# Patient Record
Sex: Male | Born: 1980 | Race: Black or African American | Hispanic: No | Marital: Single | Smoking: Former smoker
Health system: Southern US, Community
[De-identification: ages and names within clinical notes are randomized; demographics above are authoritative.]

## PROBLEM LIST (undated history)

## (undated) DIAGNOSIS — M719 Bursopathy, unspecified: Secondary | ICD-10-CM

## (undated) DIAGNOSIS — G43909 Migraine, unspecified, not intractable, without status migrainosus: Secondary | ICD-10-CM

## (undated) DIAGNOSIS — I10 Essential (primary) hypertension: Secondary | ICD-10-CM

## (undated) DIAGNOSIS — E669 Obesity, unspecified: Secondary | ICD-10-CM

## (undated) DIAGNOSIS — B019 Varicella without complication: Secondary | ICD-10-CM

## (undated) HISTORY — DX: Migraine, unspecified, not intractable, without status migrainosus: G43.909

## (undated) HISTORY — DX: Varicella without complication: B01.9

## (undated) HISTORY — DX: Essential (primary) hypertension: I10

---

## 2014-12-09 ENCOUNTER — Ambulatory Visit (INDEPENDENT_AMBULATORY_CARE_PROVIDER_SITE_OTHER)
Admission: RE | Admit: 2014-12-09 | Discharge: 2014-12-09 | Disposition: A | Discharge status: Home / Self Care | Payer: PRIVATE HEALTH INSURANCE | Source: Ambulatory Visit | Attending: Family | Admitting: Family

## 2014-12-09 ENCOUNTER — Ambulatory Visit (INDEPENDENT_AMBULATORY_CARE_PROVIDER_SITE_OTHER): Payer: PRIVATE HEALTH INSURANCE | Admitting: Family

## 2014-12-09 ENCOUNTER — Encounter: Payer: Self-pay | Admitting: Family

## 2014-12-09 VITALS — BP 182/122 | HR 88 | Temp 98.1°F | Resp 18 | Ht 71.0 in | Wt 275.1 lb

## 2014-12-09 DIAGNOSIS — Z7189 Other specified counseling: Secondary | ICD-10-CM

## 2014-12-09 DIAGNOSIS — M25619 Stiffness of unspecified shoulder, not elsewhere classified: Secondary | ICD-10-CM

## 2014-12-09 DIAGNOSIS — M7580 Other shoulder lesions, unspecified shoulder: Secondary | ICD-10-CM

## 2014-12-09 DIAGNOSIS — Z7689 Persons encountering health services in other specified circumstances: Secondary | ICD-10-CM

## 2014-12-09 DIAGNOSIS — I10 Essential (primary) hypertension: Secondary | ICD-10-CM

## 2014-12-09 MED ORDER — LISINOPRIL 20 MG PO TABS: 20.0000 mg | ORAL_TABLET | Freq: Every day | ORAL | Status: DC

## 2014-12-09 NOTE — Patient Instructions (Signed)
Thank you for choosing [facility].  Summary/Instructions:  Your prescription(s) have been submitted to your pharmacy or been printed and provided for you. Please take as directed and contact our office if you believe you are having problem(s) with the medication(s) or have any questions.  Please stop by radiology on the basement level of the building for your x-rays. Your results will be released to MyChart (or called to you) after review, usually within 72 hours after test completion. If any treatments or changes are necessary, you will be notified at that same time.  Referrals have been made during this visit. You should expect to hear back from our schedulers in about 7-10 days in regards to establishing an appointment with the specialists we discussed.   If your symptoms worsen or fail to improve, please contact our office for further instruction, or in case of emergency go directly to the emergency room at the closest medical facility.   Please start taking 2 of the current 10 mg lisinopril tablets until completed. And then began the 20 mg tablets of lisinopril daily.  For your shoulders recommend stretching 3-4 times per day for 20-30 seconds at a time for each motion of his shoulder. For pain consider ibuprofen or Aleve as needed.  Adhesive Capsulitis Sometimes the shoulder becomes stiff and is painful to move. Some people say it feels as if the shoulder is frozen in place. Because of this, the condition is called "frozen shoulder." Its medical name is adhesive capsulitis.  The shoulder joint is made up of strong connective tissue that attaches the ball of the humerus to the shallow shoulder socket. This strong connective tissue is called the joint capsule. This tissue can become stiff and swollen. That is when adhesive capsulitis sets in. CAUSES  It is not always clear just what the cause adhesive capsulitis. Possibilities include:  Injury to the shoulder joint.  Strain. This  is a repetitive injury brought about by overuse.  Lack of use. Perhaps your arm or hand was otherwise injured. It might have been in a sling for awhile. Or perhaps you were not using it to avoid pain.  Referred pain. This is a sort of trick the body plays. You feel pain in the shoulder. But, the pain actually comes from an injury somewhere else in the body.  [place]-standing health problems. Several diseases can cause adhesive capsulitis. They include diabetes, heart disease, stroke, thyroid problems, rheumatoid arthritis and lung disease.  Being a women older than 40. Anyone can develop adhesive capsulitis but it is most common in women in this age group. SYMPTOMS   Pain.  It occurs when the arm is moved.  Parts of the shoulder might hurt if they are touched.  Pain is worse at night or when resting.  Soreness. It might not be strong enough to be called pain. But, the shoulder aches.  The shoulder does not move freely.  Muscle spasms.  Trouble sleeping because of shoulder ache or pain. DIAGNOSIS  To decide if you have adhesive capsulitis, your healthcare provider will probably:  Ask about symptoms you have noticed.  Ask about your history of joint pain and anything that might have caused the pain.  Ask about your overall health.  Use hands to feel your shoulder and neck.  Ask you to move your shoulder in specific directions. This may indicate the origin of the pain.  Order imaging tests; pictures of the shoulder. They help pinpoint the source of the problem. An X-ray might  be used. For more detail, an MRI is often used. An MRI details the tendons, muscles and ligaments as well as the joint. TREATMENT  Adhesive capsulitis can be treated several ways. Most treatments can be done in a clinic or in your healthcare provider's office. Be sure to discuss the different options with your caregiver. They include:  Physical therapy. You will work on specific exercises to get your  shoulder moving again. The exercises usually involve stretching. A physical therapist (a caregiver with special training) can show you what to do and what not to do. The exercises will need to be done daily.  Medication.  Over-the-counter medicines may relieve pain and inflammation (the body's way of reacting to injury or infection).  Corticosteroids. These are stronger drugs to reduce pain and inflammation. They are given by injection (shots) into the shoulder joint. Frequent treatment is not recommended.  Muscle relaxants. Medication may be prescribed to ease muscle spasms.  Treatment of underlying conditions. This means treating another condition that is causing your shoulder problem. This might be a rotator cuff (tendon) problem  Shoulder manipulation. The shoulder will be moved by your healthcare provider. You would be under general anesthesia (given a drug that puts you to sleep). You would not feel anything. Sometimes the joint will be injected with salt water (saline) at high pressure to break down internal scarring in the joint capsule.  Surgery. This is rarely needed. It may be suggested in advanced cases after all other treatment has failed. PROGNOSIS  In time, most people recover from adhesive capsulitis. Sometimes, however, the pain goes away but full movement of the shoulder does not return.  HOME CARE INSTRUCTIONS   Take any pain medications recommended by your healthcare provider. Follow the directions carefully.  If you have physical therapy, follow through with the therapist's suggestions. Be sure you understand the exercises you will be doing. You should understand:  How often the exercises should be done.  How many times each exercise should be repeated.  How Omar they should be done.  What other activities you should do, or not do.  That you should warm up before doing any exercise. Just 5 to 10 minutes will help. Small, gentle movements should get your shoulder  ready for more.  Avoid high-demand exercise that involves your shoulder such as throwing. This type of exercise can make pain worse.  Consider using cold packs. Cold may ease swelling and pain. Ask your healthcare provider if a cold pack might help you. If so, get directions on how and when to use them. SEEK MEDICAL CARE IF:   You have any questions about your medications.  Your pain continues to increase. Document Released: 09/02/2009 Document Revised: 01/28/2012 Document Reviewed: 09/02/2009 [facility] Patient Information 2015 [place], [place]. This information is not intended to replace advice given to you by your health care provider. Make sure you discuss any questions you have with your health care provider.

## 2014-12-09 NOTE — Assessment & Plan Note (Signed)
Bilateral limited range of motion of shoulders present. Obtain x-rays to rule out calcification. Concern for adhesive capsulitis or rotator cuff pathology. Refer to sports medicine for further evaluation. Recommend stretching 3-4 times daily and also directions. May use over-the-counter anti-inflammatories as needed for pain relief. Follow-up if symptoms worsen or fail to improve.

## 2014-12-09 NOTE — Progress Notes (Signed)
Pre visit review using our clinic review tool, if applicable. No additional management support is needed unless otherwise documented below in the visit note. 

## 2014-12-09 NOTE — Progress Notes (Signed)
Subjective:    Patient ID: Shawn James Fikes, male    DOB: 12-11-1980, 34 y.o.   MRN: [id number]  Chief Complaint  Patient presents with  . Establish Care    having problems with arms locking up sending a dull pain to shoulders, x2 weeks    HPI:  Shawn James Courter is a 34 y.o. male who presents today to establish care and discuss arms locking up.   Associated symptoms of sharp/dull pain in both shoulders occurs when he raises his arms overhead. This has been going on for a couple of weeks. Denies any modifying factors. Intensity is rated around a 6/10 when it occurs. Denis any trauma or neck pain.    No Known Allergies  No current outpatient prescriptions on file prior to visit.   No current facility-administered medications on file prior to visit.    Past Medical History  Diagnosis Date  . Hypertension   . Chicken pox   . Migraine     No past surgical history on file.  No family history on file.  History   Social History  . Marital Status: Single    Spouse Name: N/A    Number of Children: 1  . Years of Education: 15   Occupational History  . [employer]   . [employer] Worker    Social History Main Topics  . Smoking status: Former Smoker -- 2 years    Types: Cigars  . Smokeless tobacco: Never Used  . Alcohol Use: 0.6 oz/week    0 Not specified, 1 Cans of beer per week  . Drug Use: No  . Sexual Activity: Not on file   Other Topics Concern  . Not on file   Social History Narrative   [place], [place] Currently lives in an apartment with by himself. No pets. Fun: Movies, eating out, travel   Denies religious beliefs that would effect health care.     Review of Systems  Constitutional: Negative for fever, chills and activity change.  Eyes:       Negative for change in vision  Respiratory: Negative for chest tightness and shortness of breath.   Cardiovascular: Negative for chest pain, palpitations and leg swelling.  Musculoskeletal: Negative  for neck pain.       Positive for shoulder pain  Neurological: Negative for numbness.      Objective:    BP 182/122 mmHg  Pulse 88  Temp(Src) 98.1 F (36.7 C) (Oral)  Resp 18  Ht 5\' 11"  (1.803 m)  Wt 275 lb 1.9 oz (124.794 kg)  BMI 38.39 kg/m2  SpO2 95% Nursing note and vital signs reviewed.  Physical Exam  Constitutional: He is oriented to person, place, and time. He appears well-developed and well-nourished. No distress.  Cardiovascular: Normal rate, regular rhythm, normal heart sounds and intact distal pulses.   Pulmonary/Chest: Effort normal and breath sounds normal.  Musculoskeletal:  Right shoulder: No obvious deformity, discoloration, or edema noted. Palpable tenderness in subacromial space around insertion of supraspinatus. Significant decreased active range of motion to 90 in both shoulder flexion and abduction. Can surpass 90 in shoulder flexion with passive range of motion however gains are only approximately 10.  Left shoulder: No obvious deformity, discoloration, or edema noted. Palpable tenderness subacromial space around insertion of supraspinatus. Significant decreased active range of motion to 90 in both shoulder flexion and abduction. Passive range of motion is similar with a 90 restriction.  Neurological: He is alert and oriented to person,  place, and time.  Skin: Skin is warm and dry.  Psychiatric: He has a normal mood and affect. His behavior is normal. Judgment and thought content normal.       Assessment & Plan:

## 2014-12-09 NOTE — Assessment & Plan Note (Signed)
Blood pressure remains labile and above goal. He is currently taking lisinopril 10 mg. Increase lisinopril to 20 mg. Follow-up in 2 weeks to determine blood pressure control.

## 2014-12-12 ENCOUNTER — Telehealth: Payer: Self-pay | Admitting: Family

## 2014-12-12 NOTE — Telephone Encounter (Signed)
Please inform the patient that his right shoulder x-ray was normal with no fractures or abnormalities. However his left shoulder did show some calcification in his rotator cuff tendon, therefore please continue stretching and keep his appointment with Sports Medicine when it is scheduled. We may also need to consider referral to orthopedic surgery pending Dr. Michaelle CopasSmith's evaluation.

## 2014-12-13 NOTE — Telephone Encounter (Signed)
Pt aware of results. He understood and had no further questions.

## 2014-12-24 ENCOUNTER — Encounter: Payer: Self-pay | Admitting: Family Medicine

## 2014-12-24 ENCOUNTER — Ambulatory Visit (INDEPENDENT_AMBULATORY_CARE_PROVIDER_SITE_OTHER): Payer: PRIVATE HEALTH INSURANCE | Admitting: Family Medicine

## 2014-12-24 ENCOUNTER — Other Ambulatory Visit (INDEPENDENT_AMBULATORY_CARE_PROVIDER_SITE_OTHER): Payer: PRIVATE HEALTH INSURANCE

## 2014-12-24 VITALS — BP 148/90 | HR 112 | Ht 71.0 in | Wt 275.0 lb

## 2014-12-24 DIAGNOSIS — M7552 Bursitis of left shoulder: Secondary | ICD-10-CM

## 2014-12-24 DIAGNOSIS — M25511 Pain in right shoulder: Secondary | ICD-10-CM

## 2014-12-24 DIAGNOSIS — M7551 Bursitis of right shoulder: Secondary | ICD-10-CM | POA: Insufficient documentation

## 2014-12-24 NOTE — Patient Instructions (Signed)
Good to see you.  Ice 20 minutes 2 times daily. Usually after activity and before bed. Exercises 3 times a week.  Pennsaid twice daily See me again in 3 week.s

## 2014-12-24 NOTE — Assessment & Plan Note (Signed)
She was given injections bilaterally today. We discussed icing regimen at home exercises. We discussed avoiding certain activities. Patient did respond well to the injections. We discussed over-the-counter medications as well. Patient and will come back and see me again in 3 weeks to make sure that patient pain should be nearly completely resolved.

## 2014-12-24 NOTE — Progress Notes (Signed)
Shawn James D.O. [facility] [address] [place], [place] [address] Phone: [phone]) [phone] Subjective:    I'm seeing this patient by the request  of:  Jeanine Luzalone, Gregory, FNP   CC: Bilateral shoulder pain  MVH:[id number]:Subjective Shawn GarnerBrandon James is a 34 y.o. male coming in with complaint of bilateral shoulder pain. Patient has had this pain for quite some time. Patient does not remember any true specific injury. Patient was seen by his primary care provider and there were was x-rays taken. Patient did have some mild calcific tendinosis noted of the left rotator cuff but otherwise fairly unremarkable. Patient denies any neck pain with it. States that if he lists his shoulders above 90 bilaterally and had some pain. Patient actually states that the range of motion of the shoulders are full but he does have some mild discomfort that can keep him from certain activities. Denies any nighttime awakening her any radiation down the arms. Patient puts the severity of pain is 8 out of 10 in severity. Has not tried any significant over-the-counter medications.     Past medical history, social, surgical and family history all reviewed in electronic medical record.   Review of Systems: No headache, visual changes, nausea, vomiting, diarrhea, constipation, dizziness, abdominal pain, skin rash, fevers, chills, night sweats, weight loss, swollen lymph nodes, body aches, joint swelling, muscle aches, chest pain, shortness of breath, mood changes.   Objective Blood pressure 148/90, pulse 112, height 5\' 11"  (1.803 m), weight 275 lb (124.739 kg), SpO2 97 %.  General: No apparent distress alert and oriented x3 mood and affect normal, dressed appropriately.  HEENT: Pupils equal, extraocular movements intact  Respiratory: Patient's speak in full sentences and does not appear short of breath  Cardiovascular: No lower extremity edema, non tender, no erythema  Skin: Warm dry intact with no signs of infection or rash on  extremities or on axial skeleton.  Abdomen: Soft nontender  Neuro: Cranial nerves II through XII are intact, neurovascularly intact in all extremities with 2+ DTRs and 2+ pulses.  Lymph: No lymphadenopathy of posterior or anterior cervical chain or axillae bilaterally.  Gait normal with good balance and coordination.  MSK:  Non tender with full range of motion and good stability and symmetric strength and tone of  elbows, wrist, hip, knee and ankles bilaterally.  Shoulder: Bilateral Inspection reveals no abnormalities, atrophy or asymmetry. Palpation is normal with no tenderness over AC joint or bicipital groove. ROM is full in all planes passively. Rotator cuff strength normal throughout. signs of impingement with positive Neer and Hawkin's tests, but negative empty can sign. Speeds and Yergason's tests normal. No labral pathology noted with negative Obrien's, negative clunk and good stability. Normal scapular function observed. No painful arc and no drop arm sign. No apprehension sign  MSK US performed of: Bilateral This study was ordered, performed, and interpreted by Terrilee FilesZach Cheyan Frees D.O.  Shoulder:   Supraspinatus:  Appears normal on [place] and transverse views, Bursal bulge seen with shoulder abduction on impingement view. Patient's left shoulder does have calcific changes of the bursa. No true tear appreciated. Infraspinatus:  Appears normal on [place] and transverse views. Significant increase in Doppler flow Subscapularis:  Appears normal on Monestime and transverse views. Positive bursa Teres Minor:  Appears normal on Faas and transverse views. AC joint:  Mild arthritis Glenohumeral Joint:  Appears normal without effusion. Glenoid Labrum:  Intact without visualized tears. Biceps Tendon:  Appears normal on [place] and transverse views, no fraying of tendon, tendon  located in intertubercular groove, no subluxation with shoulder internal or external rotation.  Impression: Subacromial bursitis  bilaterally with mild calcific changes  Procedure: Real-time Ultrasound Guided Injection of right glenohumeral joint Device: GE Logiq E  Ultrasound guided injection is preferred based studies that show increased duration, increased effect, greater accuracy, decreased procedural pain, increased response rate with ultrasound guided versus blind injection.  Verbal informed consent obtained.  Time-out conducted.  Noted no overlying erythema, induration, or other signs of local infection.  Skin prepped in a sterile fashion.  Local anesthesia: Topical Ethyl chloride.  With sterile technique and under real time ultrasound guidance:  Joint visualized.  23g 1  inch needle inserted posterior approach. Pictures taken for needle placement. Patient did have injection of 2 cc of 1% lidocaine, 2 cc of 0.5% Marcaine, and 1.0 cc of Kenalog 40 mg/dL. Completed without difficulty  Pain immediately resolved suggesting accurate placement of the medication.  Advised to call if fevers/chills, erythema, induration, drainage, or persistent bleeding.  Images permanently stored and available for review in the ultrasound unit.  Impression: Technically successful ultrasound guided injection.  Procedure: Real-time Ultrasound Guided Injection of left glenohumeral joint Device: GE Logiq E  Ultrasound guided injection is preferred based studies that show increased duration, increased effect, greater accuracy, decreased procedural pain, increased response rate with ultrasound guided versus blind injection.  Verbal informed consent obtained.  Time-out conducted.  Noted no overlying erythema, induration, or other signs of local infection.  Skin prepped in a sterile fashion.  Local anesthesia: Topical Ethyl chloride.  With sterile technique and under real time ultrasound guidance:  Joint visualized.  23g 1  inch needle inserted posterior approach. Pictures taken for needle placement. Patient did have injection of 2 cc of 1%  lidocaine, 2 cc of 0.5% Marcaine, and 1cc of Kenalog 40 mg/dL. Completed without difficulty  Pain immediately resolved suggesting accurate placement of the medication.  Advised to call if fevers/chills, erythema, induration, drainage, or persistent bleeding.  Images permanently stored and available for review in the ultrasound unit.  Impression: Technically successful ultrasound guided injection.    Impression and Recommendations:     This case required medical decision making of moderate complexity.

## 2014-12-24 NOTE — Progress Notes (Signed)
Pre visit review using our clinic review tool, if applicable. No additional management support is needed unless otherwise documented below in the visit note. 

## 2015-01-13 ENCOUNTER — Encounter: Payer: Self-pay | Admitting: Family

## 2015-01-14 ENCOUNTER — Ambulatory Visit: Payer: PRIVATE HEALTH INSURANCE | Admitting: Family Medicine

## 2015-01-24 ENCOUNTER — Telehealth: Payer: Self-pay | Admitting: Family Medicine

## 2015-01-24 ENCOUNTER — Ambulatory Visit: Payer: PRIVATE HEALTH INSURANCE | Admitting: Family Medicine

## 2015-01-24 NOTE — Telephone Encounter (Signed)
Patient a no show. Ok to reschedule  ? °

## 2015-01-25 NOTE — Telephone Encounter (Signed)
Yes

## 2015-02-07 ENCOUNTER — Encounter: Payer: Self-pay | Admitting: Family Medicine

## 2015-02-07 ENCOUNTER — Ambulatory Visit (INDEPENDENT_AMBULATORY_CARE_PROVIDER_SITE_OTHER): Payer: 59 | Admitting: Family Medicine

## 2015-02-07 VITALS — BP 144/90 | HR 92 | Ht 71.0 in | Wt 269.0 lb

## 2015-02-07 DIAGNOSIS — M7552 Bursitis of left shoulder: Secondary | ICD-10-CM

## 2015-02-07 DIAGNOSIS — M7551 Bursitis of right shoulder: Secondary | ICD-10-CM

## 2015-02-07 NOTE — Progress Notes (Signed)
  Tawana ScaleZach Giovanni Bath D.O.  [facility] [address] [place], [place] [address] Phone: [phone] Subjective:    CC: Bilateral shoulder pain follow-up  NGE:[id number]:Subjective Shawn GarnerBrandon Spicher is a 34 y.o. male coming in with complaint of bilateral shoulder pain. Patient was found to have a subacromial bursitis bilaterally secondary to mostly his employment and poor posture. Patient states he is approximately 75% better. Patient states that he is able to do daily activities much better and is resting comfortably at night. Denies any radiation down the arm or any numbness or tingling.      Past medical history, social, surgical and family history all reviewed in electronic medical record.   Review of Systems: No headache, visual changes, nausea, vomiting, diarrhea, constipation, dizziness, abdominal pain, skin rash, fevers, chills, night sweats, weight loss, swollen lymph nodes, body aches, joint swelling, muscle aches, chest pain, shortness of breath, mood changes.   Objective Blood pressure 144/90, pulse 92, height 5\' 11"  (1.803 m), weight 269 lb (122.018 kg), SpO2 93 %.  General: No apparent distress alert and oriented x3 mood and affect normal, dressed appropriately.  HEENT: Pupils equal, extraocular movements intact  Respiratory: Patient's speak in full sentences and does not appear short of breath  Cardiovascular: No lower extremity edema, non tender, no erythema  Skin: Warm dry intact with no signs of infection or rash on extremities or on axial skeleton.  Abdomen: Soft nontender  Neuro: Cranial nerves II through XII are intact, neurovascularly intact in all extremities with 2+ DTRs and 2+ pulses.  Lymph: No lymphadenopathy of posterior or anterior cervical chain or axillae bilaterally.  Gait normal with good balance and coordination.  MSK:  Non tender with full range of motion and good stability and symmetric strength and tone of  elbows, wrist, hip, knee and ankles bilaterally.  Shoulder:  Bilateral Inspection reveals no abnormalities, atrophy or asymmetry. Palpation is normal with no tenderness over AC joint or bicipital groove. ROM is full in all planes passively. Rotator cuff strength normal throughout. signs of impingement with positive Neer and Hawkin's tests, but negative empty can sign. This is somewhat better than previous exam Speeds and Yergason's tests normal. No labral pathology noted with negative Obrien's, negative clunk and good stability. Normal scapular function observed. No painful arc and no drop arm sign. No apprehension sign      Impression and Recommendations:     This case required medical decision making of moderate complexity.

## 2015-02-07 NOTE — Progress Notes (Signed)
Pre visit review using our clinic review tool, if applicable. No additional management support is needed unless otherwise documented below in the visit note. 

## 2015-02-07 NOTE — Patient Instructions (Signed)
Good to see you Ice 20 minutes 2 times daily. Usually after activity and before bed. Continue the exercises 3 times a week for another 6 weeks On wall heels butt shoulder and head touching for goal og 5 minutes daily See me agai nin 6 weeks if not perfect!

## 2015-02-07 NOTE — Assessment & Plan Note (Signed)
Patient is doing per day well with conservative therapy. Patient encouraged to continue home exercises 3 times a week for the next 6 weeks. We discussed icing regimen and home exercises. Patient will come back and see me again in 6 weeks

## 2015-02-15 ENCOUNTER — Encounter: Payer: Self-pay | Admitting: Family

## 2015-02-17 MED ORDER — TADALAFIL 10 MG PO TABS: 10.0000 mg | ORAL_TABLET | Freq: Every day | ORAL | Status: DC | PRN

## 2015-02-18 MED ORDER — TADALAFIL 5 MG PO TABS: 5.0000 mg | ORAL_TABLET | Freq: Every day | ORAL | Status: DC | PRN

## 2015-02-21 ENCOUNTER — Telehealth: Payer: Self-pay

## 2015-02-21 NOTE — Telephone Encounter (Signed)
PA started via covermymeds. Cialis 5 mg (10 for 30 days)

## 2015-02-22 NOTE — Telephone Encounter (Signed)
PA for cialis was denied.   Pt informed that rx was denied.

## 2015-03-11 ENCOUNTER — Other Ambulatory Visit: Payer: Self-pay | Admitting: Family

## 2015-03-11 MED ORDER — LISINOPRIL 20 MG PO TABS: 20.0000 mg | ORAL_TABLET | Freq: Every day | ORAL | Status: DC

## 2015-03-21 ENCOUNTER — Ambulatory Visit: Payer: 59 | Admitting: Family

## 2015-03-21 ENCOUNTER — Telehealth: Payer: Self-pay | Admitting: Family

## 2015-03-21 DIAGNOSIS — Z0289 Encounter for other administrative examinations: Secondary | ICD-10-CM

## 2015-03-21 NOTE — Telephone Encounter (Signed)
Patient no showed for follow up today.  Please advise.  °

## 2015-03-22 NOTE — Telephone Encounter (Signed)
May reschedule if he calls back.  

## 2015-03-23 NOTE — Telephone Encounter (Signed)
noted 

## 2015-04-28 ENCOUNTER — Encounter: Payer: Self-pay | Admitting: Family

## 2015-05-18 ENCOUNTER — Encounter: Payer: Self-pay | Admitting: Family

## 2015-07-18 ENCOUNTER — Other Ambulatory Visit: Payer: Self-pay | Admitting: Family

## 2015-07-18 MED ORDER — LISINOPRIL 20 MG PO TABS: 20.0000 mg | ORAL_TABLET | Freq: Every day | ORAL | Status: DC

## 2015-07-18 NOTE — Addendum Note (Signed)
Addended by: Jeanine Luz D on: 07/18/2015 12:42 PM   Modules accepted: Orders

## 2015-07-29 ENCOUNTER — Encounter: Payer: Self-pay | Admitting: Family Medicine

## 2015-11-08 ENCOUNTER — Encounter: Payer: Self-pay | Admitting: Family

## 2015-11-08 ENCOUNTER — Other Ambulatory Visit: Payer: Self-pay

## 2015-11-08 MED ORDER — LISINOPRIL 20 MG PO TABS: 20.0000 mg | ORAL_TABLET | Freq: Every day | ORAL | Status: DC

## 2015-12-01 ENCOUNTER — Encounter: Payer: Self-pay | Admitting: Family

## 2015-12-05 ENCOUNTER — Encounter: Payer: Self-pay | Admitting: Family

## 2015-12-05 ENCOUNTER — Other Ambulatory Visit: Payer: PRIVATE HEALTH INSURANCE

## 2015-12-05 ENCOUNTER — Ambulatory Visit (INDEPENDENT_AMBULATORY_CARE_PROVIDER_SITE_OTHER): Payer: PRIVATE HEALTH INSURANCE | Admitting: Family

## 2015-12-05 VITALS — BP 182/102 | HR 85 | Temp 98.5°F | Resp 18 | Ht 71.0 in | Wt 270.8 lb

## 2015-12-05 DIAGNOSIS — Z202 Contact with and (suspected) exposure to infections with a predominantly sexual mode of transmission: Secondary | ICD-10-CM | POA: Diagnosis not present

## 2015-12-05 DIAGNOSIS — I10 Essential (primary) hypertension: Secondary | ICD-10-CM

## 2015-12-05 LAB — POCT URINALYSIS DIPSTICK
BILIRUBIN UA: NEGATIVE
Blood, UA: NEGATIVE
GLUCOSE UA: NEGATIVE
Ketones, UA: NEGATIVE
LEUKOCYTES UA: NEGATIVE
Nitrite, UA: NEGATIVE
PH UA: 6
Protein, UA: NEGATIVE
Spec Grav, UA: >=1.03
Urobilinogen, UA: NEGATIVE

## 2015-12-05 MED ORDER — AZITHROMYCIN 250 MG PO TABS: ORAL_TABLET | ORAL | Status: DC

## 2015-12-05 MED ORDER — CEFTRIAXONE SODIUM 1 G IJ SOLR
500.0000 mg | Freq: Once | INTRAMUSCULAR | Status: AC
  Administered 2015-12-05: 500 mg via INTRAMUSCULAR

## 2015-12-05 MED ORDER — AMLODIPINE BESYLATE 10 MG PO TABS: 10.0000 mg | ORAL_TABLET | Freq: Every day | ORAL | Status: DC

## 2015-12-05 NOTE — Patient Instructions (Signed)
Thank you for choosing [facility].  Summary/Instructions:  Your prescription(s) have been submitted to your pharmacy or been printed and provided for you. Please take as directed and contact our office if you believe you are having problem(s) with the medication(s) or have any questions.  Please stop by the lab on the basement level of the building for your blood work. Your results will be released to MyChart (or called to you) after review, usually within 72 hours after test completion. If any changes need to be made, you will be notified at that same time.  If your symptoms worsen or fail to improve, please contact our office for further instruction, or in case of emergency go directly to the emergency room at the closest medical facility.   Chlamydia, Male Chlamydia is an infection. It is spread through sexual contact. Chlamydia can be in different areas of the body. These areas include the urethra, throat, or rectum. It is important to treat chlamydia as soon as possible. It can damage other organs.  CAUSES  Chlamydia is caused by bacteria. It is a sexually transmitted disease. This means that it is passed from an infected partner during intimate contact. This contact could be with the genitals, mouth, or rectal area.  SIGNS AND SYMPTOMS  There may not be any symptoms. This is often the case early in the infection. If there are symptoms, they are usually mild and may only be noticeable in the morning. Symptoms you may notice include:   Burning with urination.  Pain or swelling in the testicles.  Watery mucus-like discharge from the penis.  Hirata-standing (chronic) pelvic pain after frequent infections.  Pain, swelling, or itching around the anus.  A sore throat.  Itching, burning, or redness in the eyes, or discharge from the eyes. DIAGNOSIS  To diagnose this infection, your health care provider will do a pelvic exam. A sample of urine or a swab from the rectum may be taken  for testing.  TREATMENT  Chlamydia is treated with antibiotic medicines. Your health care provider may test you for infection again 3 months after treatment. HOME CARE INSTRUCTIONS  Take your antibiotic medicine as directed by your health care provider. Finish the antibiotic even if you start to feel better. Incomplete treatment will put you at risk for not being able to have children (sterility).   Take medicines only as directed by your health care provider.   Rest.   Inform any sexual partners about your infection. Even if they are symptom free or have a negative culture or evaluation, they should be treated for the condition.   Do not have sex (intercourse) until treatment is completed and your health care provider says it is okay.   Keep all follow-up visits as directed by your health care provider.   Not all test results are available during your visit. If your test results are not back during the visit, make an appointment with your health care provider to find out the results. Do not assume everything is normal if you have not heard from your health care provider or the medical facility. It is your responsibility to get your test results. SEEK MEDICAL CARE IF:  You develop new joint pain.  You have a fever. SEEK IMMEDIATE MEDICAL CARE IF:   Your pain increases.   You have abnormal discharge.   You have pain during intercourse. MAKE SURE YOU:   Understand these instructions.  Will watch your condition.  Will get help right away if you  are not doing well or get worse.   This information is not intended to replace advice given to you by your health care provider. Make sure you discuss any questions you have with your health care provider.   Document Released: 11/05/2005 Document Revised: 11/26/2014 Document Reviewed: 05/14/2013 Elsevier Interactive Patient Education 2016 [employer].  Hypertension Hypertension, commonly called high blood pressure, is when  the force of blood pumping through your arteries is too strong. Your arteries are the blood vessels that carry blood from your heart throughout your body. A blood pressure reading consists of a higher number over a lower number, such as 110/72. The higher number (systolic) is the pressure inside your arteries when your heart pumps. The lower number (diastolic) is the pressure inside your arteries when your heart relaxes. Ideally you want your blood pressure below 120/80. Hypertension forces your heart to work harder to pump blood. Your arteries may become narrow or stiff. Having untreated or uncontrolled hypertension can cause heart attack, stroke, kidney disease, and other problems. RISK FACTORS Some risk factors for high blood pressure are controllable. Others are not.  Risk factors you cannot control include:   Race. You may be at higher risk if you are African American.  Age. Risk increases with age.  Gender. Men are at higher risk than women before age 66 years. After age 62, women are at higher risk than men. Risk factors you can control include:  Not getting enough exercise or physical activity.  Being overweight.  Getting too much fat, sugar, calories, or salt in your diet.  Drinking too much alcohol. SIGNS AND SYMPTOMS Hypertension does not usually cause signs or symptoms. Extremely high blood pressure (hypertensive crisis) may cause headache, anxiety, shortness of breath, and nosebleed. DIAGNOSIS To check if you have hypertension, your health care provider will measure your blood pressure while you are seated, with your arm held at the level of your heart. It should be measured at least twice using the same arm. Certain conditions can cause a difference in blood pressure between your right and left arms. A blood pressure reading that is higher than normal on one occasion does not mean that you need treatment. If it is not clear whether you have high blood pressure, you may be asked  to return on a different day to have your blood pressure checked again. Or, you may be asked to monitor your blood pressure at home for 1 or more weeks. TREATMENT Treating high blood pressure includes making lifestyle changes and possibly taking medicine. Living a healthy lifestyle can help lower high blood pressure. You may need to change some of your habits. Lifestyle changes may include:  Following the DASH diet. This diet is high in fruits, vegetables, and whole grains. It is low in salt, red meat, and added sugars.  Keep your sodium intake below 2,300 mg per day.  Getting at least 30-45 minutes of aerobic exercise at least 4 times per week.  Losing weight if necessary.  Not smoking.  Limiting alcoholic beverages.  Learning ways to reduce stress. Your health care provider may prescribe medicine if lifestyle changes are not enough to get your blood pressure under control, and if one of the following is true:  You are 21-69 years of age and your systolic blood pressure is above 140.  You are 62 years of age or older, and your systolic blood pressure is above 150.  Your diastolic blood pressure is above 90.  You have diabetes, and your  systolic blood pressure is over 140 or your diastolic blood pressure is over 90.  You have kidney disease and your blood pressure is above 140/90.  You have heart disease and your blood pressure is above 140/90. Your personal target blood pressure may vary depending on your medical conditions, your age, and other factors. HOME CARE INSTRUCTIONS  Have your blood pressure rechecked as directed by your health care provider.   Take medicines only as directed by your health care provider. Follow the directions carefully. Blood pressure medicines must be taken as prescribed. The medicine does not work as well when you skip doses. Skipping doses also puts you at risk for problems.  Do not smoke.   Monitor your blood pressure at home as directed by  your health care provider. SEEK MEDICAL CARE IF:   You think you are having a reaction to medicines taken.  You have recurrent headaches or feel dizzy.  You have swelling in your ankles.  You have trouble with your vision. SEEK IMMEDIATE MEDICAL CARE IF:  You develop a severe headache or confusion.  You have unusual weakness, numbness, or feel faint.  You have severe chest or abdominal pain.  You vomit repeatedly.  You have trouble breathing. MAKE SURE YOU:   Understand these instructions.  Will watch your condition.  Will get help right away if you are not doing well or get worse.   This information is not intended to replace advice given to you by your health care provider. Make sure you discuss any questions you have with your health care provider.   Document Released: 11/05/2005 Document Revised: 03/22/2015 Document Reviewed: 08/28/2013 Elsevier Interactive Patient Education [employer]

## 2015-12-05 NOTE — Progress Notes (Signed)
Subjective:    Patient ID: Shawn James, male    DOB: [birth year], 35 y.o.   MRN: [id number]  Chief Complaint  Patient presents with  . Exposure to STD    has been having sxs of urinary frequency, did find out he was exposed to chlamydia    HPI:  Shawn James is a 35 y.o. male who  has a past medical history of Hypertension; Chicken pox; and Migraine. and presents today for an acute office visit.  This is a new problem. Associated symptom of urinary frequency has been going on for approximately a couple of weeks . Denies dysuria, flank pain, penile discharge or urinary urgency. Has recently had unprotected intercourse and has been exposed to chlamydia which his partner tested positive for chlamydia. Denies fevers or rashes.   No Known Allergies   Current Outpatient Prescriptions on File Prior to Visit  Medication Sig Dispense Refill  . lisinopril (PRINIVIL,ZESTRIL) 20 MG tablet Take 1 tablet (20 mg total) by mouth daily. 90 tablet 1  . tadalafil (CIALIS) 5 MG tablet Take 1 tablet (5 mg total) by mouth daily as needed for erectile dysfunction. 10 tablet 0   No current facility-administered medications on file prior to visit.    Review of Systems  Constitutional: Negative for fever and chills.  Respiratory: Negative for chest tightness and shortness of breath.   Genitourinary: Positive for urgency. Negative for dysuria, hematuria, flank pain, penile swelling and penile pain.      Objective:    BP 182/102 mmHg  Pulse 85  Temp(Src) 98.5 F (36.9 C) (Oral)  Resp 18  Ht 5\' 11"  (1.803 m)  Wt 270 lb 12.8 oz (122.834 kg)  BMI 37.79 kg/m2  SpO2 96% Nursing note and vital signs reviewed.  Physical Exam  Constitutional: He is oriented to person, place, and time. He appears well-developed and well-nourished. No distress.  Cardiovascular: Normal rate, regular rhythm, normal heart sounds and intact distal pulses.   Pulmonary/Chest: Effort normal and breath sounds normal.    Genitourinary: Penis normal. No penile erythema or penile tenderness. No discharge found.  Neurological: He is alert and oriented to person, place, and time.  Skin: Skin is warm and dry.  Psychiatric: He has a normal mood and affect. His behavior is normal. Judgment and thought content normal.       Assessment & Plan:   Problem List Items Addressed This Visit      Cardiovascular and Mediastinum   Essential hypertension    Blood pressure significantly elevated today above goal 140/90 with current regimen. Patient indicates she is compliant with medication regimen and just completed working out. This has been multiple times that he is been elevated above 140/90 with current regimen. Start amlodipine. Continue to monitor blood pressure at home. Follow-up in 2 weeks for blood pressure check.      Relevant Medications   amLODipine (NORVASC) 10 MG tablet     Other   Exposure to STD - Primary    Recently exposed through unprotected intercourse to a partner that tested positive for chlamydia. In office urinalysis positive for leukocytes and negative for nitrites and hematuria. Urine sent for culture and for gonorrhea/chlamydia urine probe. Start azithromycin. In office injection of ceftriaxone provided without complication. Obtain HIV and RPR. Follow-up if symptoms worsen or do not improve with medication regimen.      Relevant Medications   azithromycin (ZITHROMAX) 250 MG tablet   Other Relevant Orders   POCT urinalysis dipstick (Completed)  Urine culture   GC/chlamydia probe amp, urine   HIV antibody   RPR

## 2015-12-05 NOTE — Assessment & Plan Note (Signed)
Recently exposed through unprotected intercourse to a partner that tested positive for chlamydia. In office urinalysis positive for leukocytes and negative for nitrites and hematuria. Urine sent for culture and for gonorrhea/chlamydia urine probe. Start azithromycin. In office injection of ceftriaxone provided without complication. Obtain HIV and RPR. Follow-up if symptoms worsen or do not improve with medication regimen.

## 2015-12-05 NOTE — Assessment & Plan Note (Signed)
Blood pressure significantly elevated today above goal 140/90 with current regimen. Patient indicates she is compliant with medication regimen and just completed working out. This has been multiple times that he is been elevated above 140/90 with current regimen. Start amlodipine. Continue to monitor blood pressure at home. Follow-up in 2 weeks for blood pressure check.

## 2015-12-05 NOTE — Progress Notes (Signed)
Pre visit review using our clinic review tool, if applicable. No additional management support is needed unless otherwise documented below in the visit note. 

## 2015-12-06 ENCOUNTER — Encounter: Payer: Self-pay | Admitting: Family

## 2015-12-06 LAB — GC/CHLAMYDIA PROBE AMP
CT PROBE, AMP APTIMA: DETECTED — AB
GC PROBE AMP APTIMA: NOT DETECTED

## 2015-12-06 LAB — HIV ANTIBODY (ROUTINE TESTING W REFLEX): HIV: NONREACTIVE

## 2015-12-06 LAB — RPR

## 2015-12-07 ENCOUNTER — Other Ambulatory Visit: Payer: Self-pay | Admitting: Family

## 2015-12-07 ENCOUNTER — Encounter: Payer: Self-pay | Admitting: Family

## 2015-12-07 LAB — URINE CULTURE
Colony Count: NO GROWTH
Organism ID, Bacteria: NO GROWTH

## 2015-12-08 ENCOUNTER — Other Ambulatory Visit: Payer: Self-pay

## 2015-12-08 MED ORDER — TADALAFIL 5 MG PO TABS: 5.0000 mg | ORAL_TABLET | Freq: Every day | ORAL | Status: DC | PRN

## 2015-12-09 ENCOUNTER — Other Ambulatory Visit: Payer: Self-pay | Admitting: Family

## 2015-12-19 ENCOUNTER — Encounter: Payer: Self-pay | Admitting: Family

## 2015-12-19 ENCOUNTER — Ambulatory Visit (INDEPENDENT_AMBULATORY_CARE_PROVIDER_SITE_OTHER): Payer: PRIVATE HEALTH INSURANCE | Admitting: Family

## 2015-12-19 ENCOUNTER — Other Ambulatory Visit: Payer: PRIVATE HEALTH INSURANCE

## 2015-12-19 ENCOUNTER — Other Ambulatory Visit: Payer: Self-pay | Admitting: Family

## 2015-12-19 VITALS — BP 150/108 | HR 112 | Temp 98.1°F | Resp 18 | Ht 71.0 in | Wt 275.8 lb

## 2015-12-19 DIAGNOSIS — Z202 Contact with and (suspected) exposure to infections with a predominantly sexual mode of transmission: Secondary | ICD-10-CM

## 2015-12-19 DIAGNOSIS — I10 Essential (primary) hypertension: Secondary | ICD-10-CM | POA: Diagnosis not present

## 2015-12-19 MED ORDER — LISINOPRIL-HYDROCHLOROTHIAZIDE 20-12.5 MG PO TABS: 1.0000 | ORAL_TABLET | Freq: Every day | ORAL | Status: DC

## 2015-12-19 NOTE — Progress Notes (Signed)
   Subjective:    Patient ID: Shawn James, male    DOB: 09-05-1981, 35 y.o.   MRN: [id number]  Chief Complaint  Patient presents with  . Follow-up    HPI:  Shawn James is a 35 y.o. male who  has a past medical history of Hypertension; Chicken pox; and Migraine. and presents today for an office follow up.  1.) Hypertension - Currently maintained on amlodipine Reports that he takes the medication as prescribed and denies adverse side effects or hypotensive readings. Notes that home systolic reading has averaged around the 150s. Has not taken the lisinopril. Denies symptoms of end organ damage.   BP Readings from Last 3 Encounters:  12/19/15 150/108  12/05/15 182/102  02/07/15 144/90   2.) Urinary urgency - Previously diagnosed with chlamydia and treated with azithromycin. Continues to experience occasional urinary urgency with no other symptoms. Denies fevers, pain, chills, or penile discharge.    No Known Allergies   Current Outpatient Prescriptions on File Prior to Visit  Medication Sig Dispense Refill  . amLODipine (NORVASC) 10 MG tablet Take 1 tablet (10 mg total) by mouth daily. 30 tablet 0  . tadalafil (CIALIS) 5 MG tablet Take 1 tablet (5 mg total) by mouth daily as needed for erectile dysfunction. 10 tablet 0   No current facility-administered medications on file prior to visit.    Review of Systems  Constitutional: Negative for fever and chills.  Eyes:       Negative for changes in vision.   Respiratory: Negative for chest tightness and shortness of breath.   Cardiovascular: Negative for chest pain, palpitations and leg swelling.  Genitourinary: Positive for frequency.      Objective:    BP 150/108 mmHg  Pulse 112  Temp(Src) 98.1 F (36.7 C) (Oral)  Resp 18  Ht  (1.803 m)  Wt 275 lb 12.8 oz (125.102 kg)  BMI 38.48 kg/m2  SpO2 97% Nursing note and vital signs reviewed.  Physical Exam  Constitutional: He is oriented to person, place, and time. He  appears well-developed and well-nourished. No distress.  Cardiovascular: Normal rate, regular rhythm, normal heart sounds and intact distal pulses.   Pulmonary/Chest: Effort normal and breath sounds normal.  Neurological: He is alert and oriented to person, place, and time.  Skin: Skin is warm and dry.  Psychiatric: He has a normal mood and affect. His behavior is normal. Judgment and thought content normal.       Assessment & Plan:   Problem List Items Addressed This Visit      Cardiovascular and Mediastinum   Essential hypertension - Primary    Blood pressure remains above goal of 140/90 with just amlodipine. Has not taken the lisinopril. Discontinue lisinopril. Start lisinopril-hydrochlorothiazide. Continue to monitor blood pressure at home and follow up for a nurse visit in 3 weeks.       Relevant Medications   lisinopril-hydrochlorothiazide (ZESTORETIC) 20-12.5 MG tablet     Other   Chlamydia contact, treated    Previously treated with azithromycin. Continues to experience symptoms of urinary urgency with no other symptoms. Obtain GC/Ch urine probe. Follow up pending urine results.       Relevant Orders   GC/chlamydia probe amp, urine

## 2015-12-19 NOTE — Progress Notes (Signed)
Pre visit review using our clinic review tool, if applicable. No additional management support is needed unless otherwise documented below in the visit note. 

## 2015-12-19 NOTE — Assessment & Plan Note (Signed)
Blood pressure remains above goal of 140/90 with just amlodipine. Has not taken the lisinopril. Discontinue lisinopril. Start lisinopril-hydrochlorothiazide. Continue to monitor blood pressure at home and follow up for a nurse visit in 3 weeks.

## 2015-12-19 NOTE — Assessment & Plan Note (Signed)
Previously treated with azithromycin. Continues to experience symptoms of urinary urgency with no other symptoms. Obtain GC/Ch urine probe. Follow up pending urine results.

## 2015-12-19 NOTE — Patient Instructions (Addendum)
Thank you for choosing [facility].  Summary/Instructions:  Please continue to take the Amlodipine.  Please start the lisinopril-hydrochlorothiazide.   Your prescription(s) have been submitted to your pharmacy or been printed and provided for you. Please take as directed and contact our office if you believe you are having problem(s) with the medication(s) or have any questions.  If your symptoms worsen or fail to improve, please contact our office for further instruction, or in case of emergency go directly to the emergency room at the closest medical facility.

## 2015-12-20 ENCOUNTER — Encounter: Payer: Self-pay | Admitting: Family

## 2015-12-20 LAB — GC/CHLAMYDIA PROBE AMP
CT Probe RNA: NOT DETECTED
GC Probe RNA: NOT DETECTED

## 2016-01-27 ENCOUNTER — Emergency Department (HOSPITAL_BASED_OUTPATIENT_CLINIC_OR_DEPARTMENT_OTHER)
Admission: EM | Admit: 2016-01-27 | Discharge: 2016-01-27 | Disposition: A | Discharge status: Home / Self Care | Payer: PRIVATE HEALTH INSURANCE | Attending: Emergency Medicine | Admitting: Emergency Medicine

## 2016-01-27 ENCOUNTER — Encounter (HOSPITAL_BASED_OUTPATIENT_CLINIC_OR_DEPARTMENT_OTHER): Payer: Self-pay | Admitting: *Deleted

## 2016-01-27 ENCOUNTER — Telehealth: Payer: Self-pay | Admitting: Family

## 2016-01-27 DIAGNOSIS — Z87891 Personal history of nicotine dependence: Secondary | ICD-10-CM | POA: Diagnosis not present

## 2016-01-27 DIAGNOSIS — Z8619 Personal history of other infectious and parasitic diseases: Secondary | ICD-10-CM | POA: Diagnosis not present

## 2016-01-27 DIAGNOSIS — Z79899 Other long term (current) drug therapy: Secondary | ICD-10-CM | POA: Insufficient documentation

## 2016-01-27 DIAGNOSIS — I10 Essential (primary) hypertension: Secondary | ICD-10-CM | POA: Diagnosis not present

## 2016-01-27 DIAGNOSIS — G43909 Migraine, unspecified, not intractable, without status migrainosus: Secondary | ICD-10-CM | POA: Diagnosis not present

## 2016-01-27 LAB — BASIC METABOLIC PANEL
ANION GAP: 9 (ref 5–15)
BUN: 15 mg/dL (ref 6–20)
CALCIUM: 9.3 mg/dL (ref 8.9–10.3)
CO2: 27 mmol/L (ref 22–32)
Chloride: 102 mmol/L (ref 101–111)
Creatinine, Ser: 1.24 mg/dL (ref 0.61–1.24)
Glucose, Bld: 98 mg/dL (ref 65–99)
Potassium: 4 mmol/L (ref 3.5–5.1)
Sodium: 138 mmol/L (ref 135–145)

## 2016-01-27 LAB — CBC WITH DIFFERENTIAL/PLATELET
BASOS ABS: 0 10*3/uL (ref 0.0–0.1)
BASOS PCT: 0 %
Eosinophils Absolute: 0.1 10*3/uL (ref 0.0–0.7)
Eosinophils Relative: 1 %
HEMATOCRIT: 45.1 % (ref 39.0–52.0)
Hemoglobin: 15.2 g/dL (ref 13.0–17.0)
Lymphocytes Relative: 17 %
Lymphs Abs: 1.3 10*3/uL (ref 0.7–4.0)
MCH: 26.6 pg (ref 26.0–34.0)
MCHC: 33.7 g/dL (ref 30.0–36.0)
MCV: 79 fL (ref 78.0–100.0)
MONO ABS: 0.4 10*3/uL (ref 0.1–1.0)
Monocytes Relative: 6 %
NEUTROS ABS: 5.6 10*3/uL (ref 1.7–7.7)
NEUTROS PCT: 76 %
Platelets: 229 10*3/uL (ref 150–400)
RBC: 5.71 MIL/uL (ref 4.22–5.81)
RDW: 13.2 % (ref 11.5–15.5)
WBC: 7.4 10*3/uL (ref 4.0–10.5)

## 2016-01-27 NOTE — ED Notes (Signed)
States went in for his regular opthamolgist appointment this am and was told his b/p was elevated and he had blood behind eyes and was told to go to the ER.  Denies pain or other problems.  States I thought I was going home.  Doesn't feel bad

## 2016-01-27 NOTE — Telephone Encounter (Signed)
Noted! Thank you

## 2016-01-27 NOTE — Telephone Encounter (Signed)
Called stating they seen patient today.  States BP was 171/120.  Also, states that patient had bleeding behind his eye.  They let the patient go.  They did tell him to try coming to our office to get in.  I advised office that patient needs to try ER.

## 2016-01-27 NOTE — ED Provider Notes (Signed)
CSN: [id number]     Arrival date & time 01/27/16  1102 History   First MD Initiated Contact with Patient 01/27/16 1129     Chief Complaint  Patient presents with  . Hypertension     (Consider location/radiation/quality/duration/timing/severity/associated sxs/prior Treatment) Patient is a 35 y.o. male presenting with hypertension. The history is provided by the patient.  Hypertension Pertinent negatives include no chest pain, no abdominal pain, no headaches and no shortness of breath.  Patient was routine ophthalmology visit today. Patient has a known history of hypertension. Does have a primary care doctor. Is taking his medications as directed by his primary care doctor. Has not run out of the medicine. Patient is has no complain of any visual changes headache stroke symptoms chest pain shortness of breath. Primary care provider unable to see him today up the pulmonologist noted that he had significantly elevated blood pressure and wanted follow-up. Patient's blood pressure upon arrival was 198/134. Again patient was asymptomatic.  Past Medical History  Diagnosis Date  . Hypertension   . Chicken pox   . Migraine    History reviewed. No pertinent past surgical history. No family history on file. Social History  Substance Use Topics  . Smoking status: Former Smoker -- 2 years    Types: Cigars  . Smokeless tobacco: Never Used  . Alcohol Use: 0.6 oz/week    0 Standard drinks or equivalent, 1 Cans of beer per week    Review of Systems  Constitutional: Negative for fever.  HENT: Negative for congestion.   Eyes: Negative for photophobia and visual disturbance.  Respiratory: Negative for shortness of breath.   Cardiovascular: Negative for chest pain.  Gastrointestinal: Negative for nausea, vomiting and abdominal pain.  Genitourinary: Negative for dysuria.  Musculoskeletal: Negative for back pain.  Skin: Negative for rash.  Neurological: Negative for headaches.  Hematological: Does  not bruise/bleed easily.  Psychiatric/Behavioral: Negative for confusion.      Allergies  Review of patient's allergies indicates no known allergies.  Home Medications   Prior to Admission medications   Medication Sig Start Date End Date Taking? Authorizing Provider  amLODipine (NORVASC) 10 MG tablet Take 1 tablet (10 mg total) by mouth daily. 12/05/15   Veryl Speak, FNP  lisinopril-hydrochlorothiazide (ZESTORETIC) 20-12.5 MG tablet Take 1 tablet by mouth daily. 12/19/15   Veryl Speak, FNP  tadalafil (CIALIS) 5 MG tablet Take 1 tablet (5 mg total) by mouth daily as needed for erectile dysfunction. 12/08/15   Veryl Speak, FNP   BP 152/108 mmHg  Pulse 78  Temp(Src) 98.3 F (36.8 C) (Oral)  Resp 18  Ht  (1.803 m)  Wt 120.203 kg  BMI 36.98 kg/m2  SpO2 98% Physical Exam  Constitutional: He is oriented to person, place, and time. He appears well-developed and well-nourished. No distress.  HENT:  Head: Normocephalic and atraumatic.  Mouth/Throat: Oropharynx is clear and moist.  Eyes: Conjunctivae and EOM are normal. Pupils are equal, round, and reactive to light.  Neck: Normal range of motion. Neck supple.  Cardiovascular: Normal rate, regular rhythm and normal heart sounds.   No murmur heard. Pulmonary/Chest: Effort normal and breath sounds normal. No respiratory distress.  Abdominal: Soft. Bowel sounds are normal. There is no tenderness.  Musculoskeletal: Normal range of motion.  Neurological: He is alert and oriented to person, place, and time. No cranial nerve deficit. He exhibits normal muscle tone. Coordination normal.  Skin: Skin is warm. No rash noted. He is not diaphoretic.  Nursing note and vitals reviewed.   ED Course  Procedures (including critical care time) Labs Review Labs Reviewed  CBC WITH DIFFERENTIAL/PLATELET  BASIC METABOLIC PANEL   Results for orders placed or performed during the hospital encounter of 01/27/16  CBC with  Differential/Platelet  Result Value Ref Range   WBC 7.4 4.0 - 10.5 K/uL   RBC 5.71 4.22 - 5.81 MIL/uL   Hemoglobin 15.2 13.0 - 17.0 g/dL   HCT 16.145.1 09.639.0 - 04.552.0 %   MCV 79.0 78.0 - 100.0 fL   MCH 26.6 26.0 - 34.0 pg   MCHC 33.7 30.0 - 36.0 g/dL   RDW 40.913.2 81.111.5 - 91.415.5 %   Platelets 229 150 - 400 K/uL   Neutrophils Relative % 76 %   Neutro Abs 5.6 1.7 - 7.7 K/uL   Lymphocytes Relative 17 %   Lymphs Abs 1.3 0.7 - 4.0 K/uL   Monocytes Relative 6 %   Monocytes Absolute 0.4 0.1 - 1.0 K/uL   Eosinophils Relative 1 %   Eosinophils Absolute 0.1 0.0 - 0.7 K/uL   Basophils Relative 0 %   Basophils Absolute 0.0 0.0 - 0.1 K/uL  Basic metabolic panel  Result Value Ref Range   Sodium 138 135 - 145 mmol/L   Potassium 4.0 3.5 - 5.1 mmol/L   Chloride 102 101 - 111 mmol/L   CO2 27 22 - 32 mmol/L   Glucose, Bld 98 65 - 99 mg/dL   BUN 15 6 - 20 mg/dL   Creatinine, Ser 7.821.24 0.61 - 1.24 mg/dL   Calcium 9.3 8.9 - 95.610.3 mg/dL   GFR calc non Af Amer >60 >60 mL/min   GFR calc Af Amer >60 >60 mL/min   Anion gap 9 5 - 15     Imaging Review No results found. I have personally reviewed and evaluated these images and lab results as part of my medical decision-making.   EKG Interpretation None      MDM   Final diagnoses:  Essential hypertension    Known history of hypertension. States he is taking his medicines as prescribed by his doctor. Patient was referred in from an ophthalmologist at noted high blood pressure. Supposedly there was some abnormal eye findings but nothing that was a true emergency. Patient without complaints of headache any visual problems chest pain shortness of breath or any strokelike symptoms. Patient's blood pressure here without any treatment improved some 152/108. May still require further adjustments by his primary care doctor. Patient's labs without any significant abnormalities. Patient stable for discharge home and follow-up with his doctor.  Patient given  precautions of when to return to the emergency department.    Vanetta MuldersScott Nygel Prokop, MD 01/27/16 1433

## 2016-01-27 NOTE — ED Notes (Signed)
Pt ambulated to restroom with no assistance, seemingly with no problem.  

## 2016-01-27 NOTE — Telephone Encounter (Signed)
I have called patient in regards.  I have told him to head to ER.  Patient states he will head on.

## 2016-01-27 NOTE — ED Notes (Signed)
States he went to an optometrist today and was told he had blood behind his eyes and elevated BP. He was advised to see his MD for treatment. His MD told him to come here. He has been taking BP medication for the past year.

## 2016-01-27 NOTE — Discharge Instructions (Signed)
Continue taking her blood pressure medicine. Make an appointment sooner than later to follow-up with your primary care doctor. If blood pressures remain high like today although they did improve here he will require adjustments of your blood pressure medicine. Return for any new or worse symptoms as we discussed.

## 2016-05-03 ENCOUNTER — Emergency Department (HOSPITAL_BASED_OUTPATIENT_CLINIC_OR_DEPARTMENT_OTHER)
Admission: EM | Admit: 2016-05-03 | Discharge: 2016-05-03 | Disposition: A | Discharge status: Home / Self Care | Payer: PRIVATE HEALTH INSURANCE | Attending: Emergency Medicine | Admitting: Emergency Medicine

## 2016-05-03 ENCOUNTER — Encounter (HOSPITAL_BASED_OUTPATIENT_CLINIC_OR_DEPARTMENT_OTHER): Payer: Self-pay | Admitting: Emergency Medicine

## 2016-05-03 DIAGNOSIS — X58XXXA Exposure to other specified factors, initial encounter: Secondary | ICD-10-CM | POA: Diagnosis not present

## 2016-05-03 DIAGNOSIS — Z87891 Personal history of nicotine dependence: Secondary | ICD-10-CM | POA: Insufficient documentation

## 2016-05-03 DIAGNOSIS — Z79899 Other long term (current) drug therapy: Secondary | ICD-10-CM | POA: Diagnosis not present

## 2016-05-03 DIAGNOSIS — S025XXA Fracture of tooth (traumatic), initial encounter for closed fracture: Secondary | ICD-10-CM | POA: Insufficient documentation

## 2016-05-03 DIAGNOSIS — Y999 Unspecified external cause status: Secondary | ICD-10-CM | POA: Insufficient documentation

## 2016-05-03 DIAGNOSIS — Y9389 Activity, other specified: Secondary | ICD-10-CM | POA: Insufficient documentation

## 2016-05-03 DIAGNOSIS — Z6836 Body mass index (BMI) 36.0-36.9, adult: Secondary | ICD-10-CM | POA: Insufficient documentation

## 2016-05-03 DIAGNOSIS — Y929 Unspecified place or not applicable: Secondary | ICD-10-CM | POA: Insufficient documentation

## 2016-05-03 DIAGNOSIS — E669 Obesity, unspecified: Secondary | ICD-10-CM | POA: Insufficient documentation

## 2016-05-03 DIAGNOSIS — I10 Essential (primary) hypertension: Secondary | ICD-10-CM | POA: Diagnosis not present

## 2016-05-03 DIAGNOSIS — K0889 Other specified disorders of teeth and supporting structures: Secondary | ICD-10-CM | POA: Diagnosis present

## 2016-05-03 HISTORY — DX: Obesity, unspecified: E66.9

## 2016-05-03 MED ORDER — HYDROCODONE-ACETAMINOPHEN 5-325 MG PO TABS: 1.0000 | ORAL_TABLET | Freq: Four times a day (QID) | ORAL | Status: DC | PRN

## 2016-05-03 MED ORDER — PENICILLIN V POTASSIUM 500 MG PO TABS: 500.0000 mg | ORAL_TABLET | Freq: Four times a day (QID) | ORAL | Status: AC

## 2016-05-03 NOTE — ED Notes (Signed)
Broken molar on left upper x3 days.  Does not currently have a dentist.

## 2016-05-03 NOTE — ED Provider Notes (Signed)
CSN: [id number]     Arrival date & time 05/03/16  0148 History   First MD Initiated Contact with Patient 05/03/16 0304     Chief Complaint  Patient presents with  . Dental Pain     (Consider location/radiation/quality/duration/timing/severity/associated sxs/prior Treatment) HPI  This is a 35 year old male who fractured his left upper third molar while eating 4 days ago. He is here with 3 days of pain in that tooth which become moderate to severe. It is worse with drinking. He states the pain is radiating to the left side of his face. He does not have a Education officer, communitydentist.   Past Medical History  Diagnosis Date  . Hypertension   . Chicken pox   . Migraine   . Obesity    History reviewed. No pertinent past surgical history. No family history on file. Social History  Substance Use Topics  . Smoking status: Former Smoker -- 2 years    Types: Cigars  . Smokeless tobacco: Never Used  . Alcohol Use: 0.6 oz/week    1 Cans of beer, 0 Standard drinks or equivalent per week     Comment: socially    Review of Systems  All other systems reviewed and are negative.   Allergies  Review of patient's allergies indicates no known allergies.  Home Medications   Prior to Admission medications   Medication Sig Start Date End Date Taking? Authorizing Provider  amLODipine (NORVASC) 10 MG tablet Take 1 tablet (10 mg total) by mouth daily. 12/05/15   Veryl SpeakGregory D Calone, FNP  lisinopril-hydrochlorothiazide (ZESTORETIC) 20-12.5 MG tablet Take 1 tablet by mouth daily. 12/19/15   Veryl SpeakGregory D Calone, FNP  tadalafil (CIALIS) 5 MG tablet Take 1 tablet (5 mg total) by mouth daily as needed for erectile dysfunction. 12/08/15   Veryl SpeakGregory D Calone, FNP   BP 184/105 mmHg  Pulse 84  Resp 18  Ht 5\' 11"  (1.803 m)  Wt 260 lb (117.935 kg)  BMI 36.28 kg/m2  SpO2 95%   Physical Exam  General: Well-developed, well-nourished male in no acute distress; appearance consistent with age of record HENT: normocephalic; atraumatic;  fractured left upper third molar with tenderness to percussion Eyes: pupils equal, round and reactive to light; extraocular muscles intact Neck: supple; no lymphadenopathy Heart: regular rate and rhythm Lungs: clear to auscultation bilaterally Abdomen: soft; bowel sounds present Extremities: No deformity; full range of motion; pulses normal Neurologic: Awake, alert and oriented; motor function intact in all extremities and symmetric; no facial droop Skin: Warm and dry Psychiatric: Normal mood and affect    ED Course  Procedures (including critical care time)   MDM     Paula LibraJohn Kassidy Frankson, MD 05/03/16 [phone]

## 2016-05-14 ENCOUNTER — Ambulatory Visit (INDEPENDENT_AMBULATORY_CARE_PROVIDER_SITE_OTHER): Payer: PRIVATE HEALTH INSURANCE | Admitting: Family

## 2016-05-14 ENCOUNTER — Encounter: Payer: Self-pay | Admitting: Family

## 2016-05-14 VITALS — BP 160/110 | HR 95 | Temp 98.5°F | Resp 16 | Ht 71.0 in | Wt 272.0 lb

## 2016-05-14 DIAGNOSIS — I10 Essential (primary) hypertension: Secondary | ICD-10-CM | POA: Diagnosis not present

## 2016-05-14 DIAGNOSIS — Z23 Encounter for immunization: Secondary | ICD-10-CM | POA: Diagnosis not present

## 2016-05-14 MED ORDER — LISINOPRIL-HYDROCHLOROTHIAZIDE 20-12.5 MG PO TABS: 2.0000 | ORAL_TABLET | Freq: Every day | ORAL | Status: DC

## 2016-05-14 MED ORDER — AMLODIPINE BESYLATE 10 MG PO TABS: 10.0000 mg | ORAL_TABLET | Freq: Every day | ORAL | Status: DC

## 2016-05-14 NOTE — Patient Instructions (Addendum)
Thank you for choosing [facility].  Summary/Instructions:  Your prescription(s) have been submitted to your pharmacy or been printed and provided for you. Please take as directed and contact our office if you believe you are having problem(s) with the medication(s) or have any questions.  If your symptoms worsen or fail to improve, please contact our office for further instruction, or in case of emergency go directly to the emergency room at the closest medical facility.   Please continue with decreasing sodium/salt in diet.  Continue to monitor blood pressure at home.   Increase your lisinopril-hydrochlorothiazide to 2 tablets by mouth daily.  Start amlodipine daily.

## 2016-05-14 NOTE — Progress Notes (Signed)
Subjective:    Patient ID: Jhonnie GarnerBrandon Mandelbaum, male    DOB: Nov 25, 1980, 35 y.o.   MRN: [id number]  Chief Complaint  Patient presents with  . Follow-up    BP medication was working for a while and he states that BP has been running high again    HPI:  Jhonnie GarnerBrandon Adamczak is a 35 y.o. male who  has a past medical history of Hypertension; Chicken pox; Migraine; and Obesity. and presents today for a follow up office visit.  1.) Hypertension - currently maintained on lisinopril-hydrochlorothiazide. Reports taking medication as prescribed and denies adverse side effects. Recently seen by opthalmology and noted to have a ruptured blood vessel in his eye attributed to his hypertension. No other symptoms of end organ damage. Blood pressure at home has been averaging about 160-170 systolic and 107 diastolic. Continues to work on Occupational psychologistnutrition and lifestyle management.  BP Readings from Last 3 Encounters:  05/14/16 160/110  05/03/16 184/105  01/27/16 161/106     No Known Allergies   Current Outpatient Prescriptions on File Prior to Visit  Medication Sig Dispense Refill  . HYDROcodone-acetaminophen (NORCO) 5-325 MG tablet Take 1-2 tablets by mouth every 6 (six) hours as needed. 10 tablet 0  . tadalafil (CIALIS) 5 MG tablet Take 1 tablet (5 mg total) by mouth daily as needed for erectile dysfunction. 10 tablet 0   No current facility-administered medications on file prior to visit.    Review of Systems  Constitutional: Negative for fever and chills.  Eyes:       Negative for changes in vision  Respiratory: Negative for cough, chest tightness and wheezing.   Cardiovascular: Negative for chest pain, palpitations and leg swelling.  Neurological: Negative for dizziness, weakness and light-headedness.      Objective:    BP 160/110 mmHg  Pulse 95  Temp(Src) 98.5 F (36.9 C) (Oral)  Resp 16  Ht 5\' 11"  (1.803 m)  Wt 272 lb (123.378 kg)  BMI 37.95 kg/m2  SpO2 98% Nursing note and vital signs  reviewed.  Physical Exam  Constitutional: He is oriented to person, place, and time. He appears well-developed and well-nourished. No distress.  Cardiovascular: Normal rate, regular rhythm, normal heart sounds and intact distal pulses.  Exam reveals no gallop and no friction rub.   No murmur heard. Pulmonary/Chest: Effort normal and breath sounds normal.  Neurological: He is alert and oriented to person, place, and time.  Skin: Skin is warm and dry.  Psychiatric: He has a normal mood and affect. His behavior is normal. Judgment and thought content normal.       Assessment & Plan:   Problem List Items Addressed This Visit      Cardiovascular and Mediastinum   Essential hypertension - Primary    Hypertension remains uncontrolled with current regimen with no adverse side effects above goal 140/90. Most recent ophthalmology exam with some blood vessel damage secondary to hypertension. No other symptoms of end organ damage. Increase lisinopril-hydrochlorothiazide. Start amlodipine. Continue to monitor blood pressures at home. Encouraged to decrease sodium intake. If blood pressure remains elevated consider additional testing including renal ultrasound and 24 hour urine for catecholamines. Follow-up in 2 weeks for blood pressure check.      Relevant Medications   amLODipine (NORVASC) 10 MG tablet   lisinopril-hydrochlorothiazide (ZESTORETIC) 20-12.5 MG tablet       I have changed Mr. Edwin DadaLong's lisinopril-hydrochlorothiazide. I am also having him start on amLODipine. Additionally, I am having him maintain his tadalafil and HYDROcodone-acetaminophen.  Meds ordered this encounter  Medications  . amLODipine (NORVASC) 10 MG tablet    Sig: Take 1 tablet (10 mg total) by mouth daily.    Dispense:  90 tablet    Refill:  3    Order Specific Question:  Supervising Provider    Answer:  Hillard DankerRAWFORD, ELIZABETH A [[id number]]  . lisinopril-hydrochlorothiazide (ZESTORETIC) 20-12.5 MG tablet    Sig: Take 2  tablets by mouth daily.    Dispense:  180 tablet    Refill:  0    Fill with next prescription. Please discontinue previous lisinopril-hydrochlorothiazide order.    Order Specific Question:  Supervising Provider    Answer:  Hillard DankerRAWFORD, ELIZABETH A [[id number]]     Follow-up: Return in about 2 weeks (around 05/28/2016) for Nurse visit for BP. Jeanine Luz.  Calone, Gregory, FNP

## 2016-05-14 NOTE — Assessment & Plan Note (Signed)
Hypertension remains uncontrolled with current regimen with no adverse side effects above goal 140/90. Most recent ophthalmology exam with some blood vessel damage secondary to hypertension. No other symptoms of end organ damage. Increase lisinopril-hydrochlorothiazide. Start amlodipine. Continue to monitor blood pressures at home. Encouraged to decrease sodium intake. If blood pressure remains elevated consider additional testing including renal ultrasound and 24 hour urine for catecholamines. Follow-up in 2 weeks for blood pressure check.

## 2016-05-14 NOTE — Progress Notes (Signed)
Pre visit review using our clinic review tool, if applicable. No additional management support is needed unless otherwise documented below in the visit note. 

## 2016-09-07 ENCOUNTER — Emergency Department (HOSPITAL_BASED_OUTPATIENT_CLINIC_OR_DEPARTMENT_OTHER)
Admission: EM | Admit: 2016-09-07 | Discharge: 2016-09-07 | Disposition: A | Discharge status: Home / Self Care | Payer: Worker's Compensation | Attending: Emergency Medicine | Admitting: Emergency Medicine

## 2016-09-07 ENCOUNTER — Encounter (HOSPITAL_BASED_OUTPATIENT_CLINIC_OR_DEPARTMENT_OTHER): Payer: Self-pay

## 2016-09-07 DIAGNOSIS — Z87891 Personal history of nicotine dependence: Secondary | ICD-10-CM | POA: Diagnosis not present

## 2016-09-07 DIAGNOSIS — R55 Syncope and collapse: Secondary | ICD-10-CM

## 2016-09-07 DIAGNOSIS — S0081XA Abrasion of other part of head, initial encounter: Secondary | ICD-10-CM

## 2016-09-07 DIAGNOSIS — Y990 Civilian activity done for income or pay: Secondary | ICD-10-CM | POA: Diagnosis not present

## 2016-09-07 DIAGNOSIS — Y9384 Activity, sleeping: Secondary | ICD-10-CM | POA: Insufficient documentation

## 2016-09-07 DIAGNOSIS — Y929 Unspecified place or not applicable: Secondary | ICD-10-CM | POA: Insufficient documentation

## 2016-09-07 DIAGNOSIS — I10 Essential (primary) hypertension: Secondary | ICD-10-CM | POA: Diagnosis not present

## 2016-09-07 DIAGNOSIS — S0121XA Laceration without foreign body of nose, initial encounter: Secondary | ICD-10-CM

## 2016-09-07 DIAGNOSIS — S0993XA Unspecified injury of face, initial encounter: Secondary | ICD-10-CM | POA: Diagnosis present

## 2016-09-07 DIAGNOSIS — W1839XA Other fall on same level, initial encounter: Secondary | ICD-10-CM | POA: Insufficient documentation

## 2016-09-07 DIAGNOSIS — Z79899 Other long term (current) drug therapy: Secondary | ICD-10-CM | POA: Insufficient documentation

## 2016-09-07 LAB — URINALYSIS, ROUTINE W REFLEX MICROSCOPIC
BILIRUBIN URINE: NEGATIVE
Glucose, UA: NEGATIVE mg/dL
HGB URINE DIPSTICK: NEGATIVE
Ketones, ur: NEGATIVE mg/dL
Leukocytes, UA: NEGATIVE
NITRITE: NEGATIVE
PROTEIN: NEGATIVE mg/dL
Specific Gravity, Urine: 1.016 (ref 1.005–1.030)
pH: 6.5 (ref 5.0–8.0)

## 2016-09-07 LAB — BASIC METABOLIC PANEL
ANION GAP: 11 (ref 5–15)
BUN: 17 mg/dL (ref 6–20)
CALCIUM: 9.7 mg/dL (ref 8.9–10.3)
CO2: 22 mmol/L (ref 22–32)
CREATININE: 1.15 mg/dL (ref 0.61–1.24)
Chloride: 104 mmol/L (ref 101–111)
Glucose, Bld: 92 mg/dL (ref 65–99)
Potassium: 3.9 mmol/L (ref 3.5–5.1)
SODIUM: 137 mmol/L (ref 135–145)

## 2016-09-07 LAB — TROPONIN I

## 2016-09-07 LAB — DIFFERENTIAL
BASOS ABS: 0 10*3/uL (ref 0.0–0.1)
Basophils Relative: 0 %
EOS PCT: 1 %
Eosinophils Absolute: 0.1 10*3/uL (ref 0.0–0.7)
LYMPHS PCT: 13 %
Lymphs Abs: 1.5 10*3/uL (ref 0.7–4.0)
Monocytes Absolute: 0.6 10*3/uL (ref 0.1–1.0)
Monocytes Relative: 6 %
NEUTROS PCT: 80 %
Neutro Abs: 8.8 10*3/uL — ABNORMAL HIGH (ref 1.7–7.7)

## 2016-09-07 LAB — CBC
HCT: 46.8 % (ref 39.0–52.0)
HEMOGLOBIN: 16.1 g/dL (ref 13.0–17.0)
MCH: 27.6 pg (ref 26.0–34.0)
MCHC: 34.4 g/dL (ref 30.0–36.0)
MCV: 80.3 fL (ref 78.0–100.0)
PLATELETS: 283 10*3/uL (ref 150–400)
RBC: 5.83 MIL/uL — AB (ref 4.22–5.81)
RDW: 13.1 % (ref 11.5–15.5)
WBC: 10.9 10*3/uL — ABNORMAL HIGH (ref 4.0–10.5)

## 2016-09-07 NOTE — ED Provider Notes (Addendum)
[facility] Provider Note: Lowella DellJ. Lane Jaymian Bogart, MD, FACEP  CSN: [id number] MRN: [id number] ARRIVAL: 09/07/16 at 0101 ROOM: [facility]   CHIEF COMPLAINT  Facial Injury   HISTORY OF PRESENT ILLNESS  Shawn James is a 35 y.o. 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 He either fell asleep or passed out while at work this morning. He remembers no prodromal symptoms other than feeling sleepy. He struck his face on air conditioning duct and has a laceration to the right side of his nose as well as an abrasion to his right cheek. There is minimal associated pain and bleeding is controlled. He denies neck pain. He denies chest pain or shortness of breath. He has been told he snores heavily but has never been assessed for sleep apnea.    Past Medical History:  Diagnosis Date  . Chicken pox   . Hypertension   . Migraine   . Obesity     History reviewed. No pertinent surgical history.  No family history on file.  Social History  Substance Use Topics  . Smoking status: Former Smoker    Years: 2.00    Types: Cigars  . Smokeless tobacco: Never Used  . Alcohol use 0.6 oz/week    1 Cans of beer per week     Comment: socially    Prior to Admission medications   Medication Sig Start Date End Date Taking? Authorizing Provider  amLODipine (NORVASC) 10 MG tablet Take 1 tablet (10 mg total) by mouth daily. 05/14/16   Veryl SpeakGregory D Calone, FNP  HYDROcodone-acetaminophen (NORCO) 5-325 MG tablet Take 1-2 tablets by mouth every 6 (six) hours as needed. 05/03/16   Chaz Mcglasson, MD  lisinopril-hydrochlorothiazide (ZESTORETIC) 20-12.5 MG tablet Take 2 tablets by mouth daily. 05/14/16   Veryl SpeakGregory D Calone, FNP  tadalafil (CIALIS) 5 MG tablet Take 1 tablet (5 mg total) by mouth daily as needed for erectile dysfunction. 12/08/15   Veryl SpeakGregory D Calone, FNP    Allergies Review of patient's allergies indicates no known allergies.   REVIEW OF SYSTEMS  Negative except as noted here or in the History of Present  Illness.   PHYSICAL EXAMINATION  Initial Vital Signs Blood pressure (!) 157/102, pulse 87, temperature 97.7 F (36.5 C), temperature source Oral, resp. rate 18, height 5\' 11"  (1.803 m), weight 260 lb (117.9 kg), SpO2 99 %.  Examination General: Well-developed, well-nourished male in no acute distress; appearance consistent with age of record HENT: normocephalic; abrasion right cheek; laceration right side of nose Eyes: pupils equal, round and reactive to light; extraocular muscles intact Neck: supple Heart: regular rate and rhythm Lungs: clear to auscultation bilaterally Abdomen: soft; nondistended; nontender; bowel sounds present Extremities: No deformity; full range of motion; pulses normal Neurologic: Awake, alert and oriented; motor function intact in all extremities and symmetric; no facial droop Skin: Warm and dry Psychiatric: Normal mood and affect   RESULTS  Summary of this visit's results, reviewed by myself:   EKG Interpretation  Date/Time:  Friday September 07 2016 02:52:30 EDT Ventricular Rate:  77 PR Interval:    QRS Duration: 95 QT Interval:  348 QTC Calculation: 394 R Axis:   40 Text Interpretation:  Sinus rhythm Probable LVH with secondary repol abnrm ST depr, consider ischemia, inferior leads No previous ECGs available Confirmed by Jaye Saal  MD, Jonny RuizJOHN ([id number]) on 09/07/2016 5:03:41 AM      Laboratory Studies: Results for orders placed or performed during the hospital encounter of 09/07/16 (from the past 24 hour(s))  Basic  metabolic panel     Status: None   Collection Time: 09/07/16  3:10 AM  Result Value Ref Range   Sodium 137 135 - 145 mmol/L   Potassium 3.9 3.5 - 5.1 mmol/L   Chloride 104 101 - 111 mmol/L   CO2 22 22 - 32 mmol/L   Glucose, Bld 92 65 - 99 mg/dL   BUN 17 6 - 20 mg/dL   Creatinine, Ser 4.09 0.61 - 1.24 mg/dL   Calcium 9.7 8.9 - 81.1 mg/dL   GFR calc non Af Amer >60 >60 mL/min   GFR calc Af Amer >60 >60 mL/min   Anion gap 11 5 - 15  CBC      Status: Abnormal   Collection Time: 09/07/16  3:10 AM  Result Value Ref Range   WBC 10.9 (H) 4.0 - 10.5 K/uL   RBC 5.83 (H) 4.22 - 5.81 MIL/uL   Hemoglobin 16.1 13.0 - 17.0 g/dL   HCT 91.4 78.2 - 95.6 %   MCV 80.3 78.0 - 100.0 fL   MCH 27.6 26.0 - 34.0 pg   MCHC 34.4 30.0 - 36.0 g/dL   RDW 21.3 08.6 - 57.8 %   Platelets 283 150 - 400 K/uL  Differential     Status: Abnormal   Collection Time: 09/07/16  3:10 AM  Result Value Ref Range   Neutrophils Relative % 80 %   Neutro Abs 8.8 (H) 1.7 - 7.7 K/uL   Lymphocytes Relative 13 %   Lymphs Abs 1.5 0.7 - 4.0 K/uL   Monocytes Relative 6 %   Monocytes Absolute 0.6 0.1 - 1.0 K/uL   Eosinophils Relative 1 %   Eosinophils Absolute 0.1 0.0 - 0.7 K/uL   Basophils Relative 0 %   Basophils Absolute 0.0 0.0 - 0.1 K/uL  Troponin I     Status: None   Collection Time: 09/07/16  3:10 AM  Result Value Ref Range   Troponin I <0.03 <0.03 ng/mL  Urinalysis, Routine w reflex microscopic     Status: None   Collection Time: 09/07/16  3:17 AM  Result Value Ref Range   Color, Urine YELLOW YELLOW   APPearance CLEAR CLEAR   Specific Gravity, Urine 1.016 1.005 - 1.030   pH 6.5 5.0 - 8.0   Glucose, UA NEGATIVE NEGATIVE mg/dL   Hgb urine dipstick NEGATIVE NEGATIVE   Bilirubin Urine NEGATIVE NEGATIVE   Ketones, ur NEGATIVE NEGATIVE mg/dL   Protein, ur NEGATIVE NEGATIVE mg/dL   Nitrite NEGATIVE NEGATIVE   Leukocytes, UA NEGATIVE NEGATIVE   Imaging Studies: No results found.  ED COURSE  Nursing notes and initial vitals signs, including pulse oximetry, reviewed.  Vitals:   09/07/16 0118  BP: (!) 157/102  Pulse: 87  Resp: 18  Temp: 97.7 F (36.5 C)  TempSrc: Oral  SpO2: 99%  Weight: 260 lb (117.9 kg)  Height: 5\' 11"  (1.803 m)   5:40 AM The patient's story is consistent with a syncopal episode. Given his body habitus and history of loud snoring I am suspicious of somnolence as a result of sleep apnea. We will have him follow-up with his PCP  for further evaluation and treatment.  PROCEDURES   LACERATION REPAIR Performed by: Hanley Seamen Authorized by: Hanley Seamen Consent: Verbal consent obtained. Risks and benefits: risks, benefits and alternatives were discussed Consent given by: patient Patient identity confirmed: provided demographic data Prepped and Draped in normal sterile fashion Wound explored  Laceration Location: Right side of nose involving the naris  Laceration  Length: 1.5 cm  No Foreign Bodies seen or palpated  Anesthesia: None  Irrigation method: syringe Amount of cleaning: standard  Skin closure: Dermabond   Patient tolerance: Patient tolerated the procedure well with no immediate complications.   ED DIAGNOSES     ICD-9-CM ICD-10-CM   1. Syncope and collapse 780.2 R55   2. Laceration of nose, initial encounter 873.20 S01.21XA   3. Abrasion of cheek, initial encounter 910.0 S00.81XA        Paula Libra, MD 09/07/16 4098    Paula Libra, MD 09/07/16 [phone]

## 2016-09-07 NOTE — ED Triage Notes (Signed)
Pt fell asleep while at work and hit his face on an air conditioning duct, laceration to right nostril and abrasions to right cheek

## 2016-10-06 ENCOUNTER — Other Ambulatory Visit: Payer: Self-pay | Admitting: Family

## 2016-10-06 DIAGNOSIS — I10 Essential (primary) hypertension: Secondary | ICD-10-CM

## 2016-10-08 ENCOUNTER — Other Ambulatory Visit: Payer: Self-pay | Admitting: Family

## 2016-10-08 DIAGNOSIS — I10 Essential (primary) hypertension: Secondary | ICD-10-CM

## 2016-10-29 ENCOUNTER — Emergency Department (HOSPITAL_COMMUNITY)
Admission: EM | Admit: 2016-10-29 | Discharge: 2016-10-29 | Disposition: A | Discharge status: Home / Self Care | Payer: No Typology Code available for payment source | Attending: Emergency Medicine | Admitting: Emergency Medicine

## 2016-10-29 ENCOUNTER — Encounter (HOSPITAL_COMMUNITY): Payer: Self-pay | Admitting: *Deleted

## 2016-10-29 DIAGNOSIS — I10 Essential (primary) hypertension: Secondary | ICD-10-CM | POA: Insufficient documentation

## 2016-10-29 DIAGNOSIS — S199XXA Unspecified injury of neck, initial encounter: Secondary | ICD-10-CM | POA: Diagnosis not present

## 2016-10-29 DIAGNOSIS — Y92410 Unspecified street and highway as the place of occurrence of the external cause: Secondary | ICD-10-CM | POA: Insufficient documentation

## 2016-10-29 DIAGNOSIS — Y999 Unspecified external cause status: Secondary | ICD-10-CM | POA: Diagnosis not present

## 2016-10-29 DIAGNOSIS — R51 Headache: Secondary | ICD-10-CM | POA: Diagnosis not present

## 2016-10-29 DIAGNOSIS — M546 Pain in thoracic spine: Secondary | ICD-10-CM | POA: Diagnosis not present

## 2016-10-29 DIAGNOSIS — Y939 Activity, unspecified: Secondary | ICD-10-CM | POA: Insufficient documentation

## 2016-10-29 DIAGNOSIS — Z87891 Personal history of nicotine dependence: Secondary | ICD-10-CM | POA: Diagnosis not present

## 2016-10-29 HISTORY — DX: Bursopathy, unspecified: M71.9

## 2016-10-29 MED ORDER — METHOCARBAMOL 500 MG PO TABS: 500.0000 mg | ORAL_TABLET | Freq: Two times a day (BID) | ORAL | 0 refills | Status: DC

## 2016-10-29 MED ORDER — IBUPROFEN 600 MG PO TABS: 600.0000 mg | ORAL_TABLET | Freq: Four times a day (QID) | ORAL | 0 refills | Status: DC | PRN

## 2016-10-29 MED ORDER — IBUPROFEN 400 MG PO TABS
600.0000 mg | ORAL_TABLET | Freq: Once | ORAL | Status: AC
  Administered 2016-10-29: 600 mg via ORAL
  Filled 2016-10-29: qty 1
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
## 2016-10-29 NOTE — Discharge Instructions (Signed)
Take anti-inflammatory medicines for the next week. Take this medicine with food. °Take muscle relaxer at bedtime to help you sleep. This medicine makes you drowsy so do not take before driving or work °Use a heating pad for sore muscles - use for 20 minutes several times a day ° °

## 2016-10-29 NOTE — ED Notes (Signed)
Declined W/C at D/C and was escorted to lobby by RN. 
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
## 2016-11-05 ENCOUNTER — Ambulatory Visit (INDEPENDENT_AMBULATORY_CARE_PROVIDER_SITE_OTHER): Payer: PRIVATE HEALTH INSURANCE | Admitting: Family

## 2016-11-05 ENCOUNTER — Encounter: Payer: Self-pay | Admitting: Family

## 2016-11-05 DIAGNOSIS — S29012D Strain of muscle and tendon of back wall of thorax, subsequent encounter: Secondary | ICD-10-CM

## 2016-11-05 DIAGNOSIS — Z23 Encounter for immunization: Secondary | ICD-10-CM

## 2016-11-05 DIAGNOSIS — S29012A Strain of muscle and tendon of back wall of thorax, initial encounter: Secondary | ICD-10-CM | POA: Insufficient documentation

## 2016-11-05 DIAGNOSIS — M62830 Muscle spasm of back: Secondary | ICD-10-CM | POA: Insufficient documentation

## 2016-11-05 MED ORDER — PREDNISONE 10 MG (21) PO TBPK: ORAL_TABLET | ORAL | 0 refills | Status: DC

## 2016-11-05 MED ORDER — TIZANIDINE HCL 4 MG PO TABS: 4.0000 mg | ORAL_TABLET | Freq: Four times a day (QID) | ORAL | 1 refills | Status: DC | PRN
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
## 2016-11-05 NOTE — Patient Instructions (Addendum)
Thank you for choosing [facility].  SUMMARY AND INSTRUCTIONS:  Ice / moist heat x 20 minutes every 2 hours as needed and after activity.  Zanaflex for muscle spasm.   Medication:  Your prescription(s) have been submitted to your pharmacy or been printed and provided for you. Please take as directed and contact our office if you believe you are having problem(s) with the medication(s) or have any questions.  Follow up:  If your symptoms worsen or fail to improve, please contact our office for further instruction, or in case of emergency go directly to the emergency room at the closest medical facility.     Mid-Back Strain Rehab Ask your health care provider which exercises are safe for you. Do exercises exactly as told by your health care provider and adjust them as directed. It is normal to feel mild stretching, pulling, tightness, or discomfort as you do these exercises, but you should stop right away if you feel sudden pain or your pain gets worse. Do not begin these exercises until told by your health care provider. Stretching and range of motion exercises This exercise warms up your muscles and joints and improves the movement and flexibility of your back and shoulders. This exercise also help to relieve pain. Exercise A: Chest and spine stretch 1. Lie down on your back on a firm surface. 2. Roll a towel or a small blanket so it is about 4 inches (10 cm) in diameter. 3. Put the towel lengthwise under the middle of your back so it is under your spine, but not under your shoulder blades. 4. To increase the stretch, you may put your hands behind your head and let your elbows fall to your sides. 5. Hold for __________ seconds. Repeat exercise __________ times. Complete this exercise __________ times a day. Strengthening exercises These exercises build strength and endurance in your back and your shoulder blade muscles. Endurance is the ability to use your muscles for a Meininger  time, even after they get tired. Exercise B: Alternating arm and leg raises 1. Get on your hands and knees on a firm surface. If you are on a hard floor, you may want to use padding to cushion your knees, such as an exercise mat. 2. Line up your arms and legs. Your hands should be below your shoulders, and your knees should be below your hips. 3. Lift your left leg behind you. At the same time, raise your right arm and straighten it in front of you.  Do not lift your leg higher than your hip.  Do not lift your arm higher than your shoulder.  Keep your abdominal and back muscles tight.  Keep your hips facing the ground.  Do not arch your back.  Keep your balance carefully, and do not hold your breath. 4. Hold for __________ seconds. 5. Slowly return to the starting position and repeat with your right leg and your left arm. Repeat __________ times. Complete this exercise __________ times a day. Exercise C: Straight arm rows (shoulder extension) 1. Stand with your feet shoulder width apart. 2. Secure an exercise band to a stable object in front of you so the band is at or above shoulder height. 3. Hold one end of the exercise band in each hand. 4. Straighten your elbows and lift your hands up to shoulder height. 5. Step back, away from the secured end of the exercise band, until the band stretches. 6. Squeeze your shoulder blades together and pull your hands down to  the sides of your thighs. Stop when your hands are straight down by your sides. Do not let your hands go behind your body. 7. Hold for __________ seconds. 8. Slowly return to the starting position. Repeat __________ times. Complete this exercise __________ times a day. Exercise D: Shoulder external rotation, prone 1. Lie on your abdomen on a firm bed so your left / right forearm hangs over the edge of the bed and your upper arm is on the bed, straight out from your body.  Your elbow should be bent.  Your palm should be  facing your feet. 2. If instructed, hold a __________ weight in your hand. 3. Squeeze your shoulder blade toward the middle of your back. Do not let your shoulder lift toward your ear. 4. Keep your elbow bent in an "L" shape (90 degrees) while you slowly move your forearm up toward the ceiling. Move your forearm up to the height of the bed, toward your head.  Your upper arm should not move.  At the top of the movement, your palm should face the floor. 5. Hold for __________ seconds. 6. Slowly return to the starting position and relax your muscles. Repeat __________ times. Complete this exercise __________ times a day. Exercise E: Scapular retraction and external rotation, rowing 1. Sit in a stable chair without armrests, or stand. 2. Secure an exercise band to a stable object in front of you so it is at shoulder height. 3. Hold one end of the exercise band in each hand. 4. Bring your arms out straight in front of you. 5. Step back, away from the secured end of the exercise band, until the band stretches. 6. Pull the band backward. As you do this, bend your elbows and squeeze your shoulder blades together, but avoid letting the rest of your body move. Do not let your shoulders lift up toward your ears. 7. Stop when your elbows are at your sides or slightly behind your body. 8. Hold for __________ seconds. 9. Slowly straighten your arms to return to the starting position. Repeat __________ times. Complete this exercise __________ times a day. Posture and body mechanics   Body mechanics refers to the movements and positions of your body while you do your daily activities. Posture is part of body mechanics. Good posture and healthy body mechanics can help to relieve stress in your body's tissues and joints. Good posture means that your spine is in its natural S-curve position (your spine is neutral), your shoulders are pulled back slightly, and your head is not tipped forward. The following are  general guidelines for applying improved posture and body mechanics to your everyday activities. Standing   When standing, keep your spine neutral and your feet about hip-width apart. Keep a slight bend in your knees. Your ears, shoulders, and hips should line up.  When you do a task in which you lean forward while standing in one place for a Sandefer time, place one foot up on a stable object that is 2-4 inches (5-10 cm) high, such as a footstool. This helps keep your spine neutral. Sitting  When sitting, keep your spine neutral and keep your feet flat on the floor. Use a footrest, if necessary, and keep your thighs parallel to the floor. Avoid rounding your shoulders, and avoid tilting your head forward.  When working at a desk or a computer, keep your desk at a height where your hands are slightly lower than your elbows. Slide your chair under your desk so  you are close enough to maintain good posture.  When working at a computer, place your monitor at a height where you are looking straight ahead and you do not have to tilt your head forward or downward to look at the screen. Resting   When lying down and resting, avoid positions that are most painful for you.  If you have pain with activities such as sitting, bending, stooping, or squatting (flexion-based activities), lie in a position in which your body does not bend very much. For example, avoid curling up on your side with your arms and knees near your chest (fetal position).  If you have pain with activities such as standing for a Laursen time or reaching with your arms (extension-based activities), lie with your spine in a neutral position and bend your knees slightly. Try the following positions:  Lying on your side with a pillow between your knees.  Lying on your back with a pillow under your knees. Lifting   When lifting objects, keep your feet at least shoulder-width apart and tighten your abdominal muscles.  Bend your knees and  hips and keep your spine neutral. It is important to lift using the strength of your legs, not your back. Do not lock your knees straight out.  Always ask for help to lift heavy or awkward objects. This information is not intended to replace advice given to you by your health care provider. Make sure you discuss any questions you have with your health care provider. Document Released: 11/05/2005 Document Revised: 07/12/2016 Document Reviewed: 08/17/2015 Elsevier Interactive Patient Education  2017 [employer]   Low Back Strain Rehab Ask your health care provider which exercises are safe for you. Do exercises exactly as told by your health care provider and adjust them as directed. It is normal to feel mild stretching, pulling, tightness, or discomfort as you do these exercises, but you should stop right away if you feel sudden pain or your pain gets worse. Do not begin these exercises until told by your health care provider. Stretching and range of motion exercises These exercises warm up your muscles and joints and improve the movement and flexibility of your back. These exercises also help to relieve pain, numbness, and tingling. Exercise A: Single knee to chest 1. Lie on your back on a firm surface with both legs straight. 2. Bend one of your knees. Use your hands to move your knee up toward your chest until you feel a gentle stretch in your lower back and buttock.  Hold your leg in this position by holding onto the front of your knee.  Keep your other leg as straight as possible. 3. Hold for __________ seconds. 4. Slowly return to the starting position. 5. Repeat with your other leg. Repeat __________ times. Complete this exercise __________ times a day. Exercise B: Prone extension on elbows 6. Lie on your abdomen on a firm surface. 7. Prop yourself up on your elbows. 8. Use your arms to help lift your chest up until you feel a gentle stretch in your abdomen and your lower  back.  This will place some of your body weight on your elbows. If this is uncomfortable, try stacking pillows under your chest.  Your hips should stay down, against the surface that you are lying on. Keep your hip and back muscles relaxed. 9. Hold for __________ seconds. 10. Slowly relax your upper body and return to the starting position. Repeat __________ times. Complete this exercise __________ times a day. Strengthening  exercises These exercises build strength and endurance in your back. Endurance is the ability to use your muscles for a Taormina time, even after they get tired. Exercise C: Pelvic tilt 1. Lie on your back on a firm surface. Bend your knees and keep your feet flat. 2. Tense your abdominal muscles. Tip your pelvis up toward the ceiling and flatten your lower back into the floor.  To help with this exercise, you may place a small towel under your lower back and try to push your back into the towel. 3. Hold for __________ seconds. 4. Let your muscles relax completely before you repeat this exercise. Repeat __________ times. Complete this exercise __________ times a day. Exercise D: Alternating arm and leg raises 7. Get on your hands and knees on a firm surface. If you are on a hard floor, you may want to use padding to cushion your knees, such as an exercise mat. 8. Line up your arms and legs. Your hands should be below your shoulders, and your knees should be below your hips. 9. Lift your left leg behind you. At the same time, raise your right arm and straighten it in front of you.  Do not lift your leg higher than your hip.  Do not lift your arm higher than your shoulder.  Keep your abdominal and back muscles tight.  Keep your hips facing the ground.  Do not arch your back.  Keep your balance carefully, and do not hold your breath. 10. Hold for __________ seconds. 11. Slowly return to the starting position and repeat with your right leg and your left arm. Repeat  __________ times. Complete this exercise __________times a day. Exercise J: Single leg lower with bent knees 1. Lie on your back on a firm surface. 2. Tense your abdominal muscles and lift your feet off the floor, one foot at a time, so your knees and hips are bent in an "L" shape (at about 90 degrees).  Your knees should be over your hips and your lower legs should be parallel to the floor. 3. Keeping your abdominal muscles tense and your knee bent, slowly lower one of your legs so your toe touches the ground. 4. Lift your leg back up to return to the starting position.  Do not hold your breath.  Do not let your back arch. Keep your back flat against the ground. 5. Repeat with your other leg. Repeat __________ times. Complete this exercise __________ times a day. Posture and body mechanics   Body mechanics refers to the movements and positions of your body while you do your daily activities. Posture is part of body mechanics. Good posture and healthy body mechanics can help to relieve stress in your body's tissues and joints. Good posture means that your spine is in its natural S-curve position (your spine is neutral), your shoulders are pulled back slightly, and your head is not tipped forward. The following are general guidelines for applying improved posture and body mechanics to your everyday activities. Standing   When standing, keep your spine neutral and your feet about hip-width apart. Keep a slight bend in your knees. Your ears, shoulders, and hips should line up.  When you do a task in which you stand in one place for a Joa time, place one foot up on a stable object that is 2-4 inches (5-10 cm) high, such as a footstool. This helps keep your spine neutral. Sitting  When sitting, keep your spine neutral and keep your feet flat  on the floor. Use a footrest, if necessary, and keep your thighs parallel to the floor. Avoid rounding your shoulders, and avoid tilting your head  forward.  When working at a desk or a computer, keep your desk at a height where your hands are slightly lower than your elbows. Slide your chair under your desk so you are close enough to maintain good posture.  When working at a computer, place your monitor at a height where you are looking straight ahead and you do not have to tilt your head forward or downward to look at the screen. Resting   When lying down and resting, avoid positions that are most painful for you.  If you have pain with activities such as sitting, bending, stooping, or squatting (flexion-based activities), lie in a position in which your body does not bend very much. For example, avoid curling up on your side with your arms and knees near your chest (fetal position).  If you have pain with activities such as standing for a Deeg time or reaching with your arms (extension-based activities), lie with your spine in a neutral position and bend your knees slightly. Try the following positions:  Lying on your side with a pillow between your knees.  Lying on your back with a pillow under your knees. Lifting   When lifting objects, keep your feet at least shoulder-width apart and tighten your abdominal muscles.  Bend your knees and hips and keep your spine neutral. It is important to lift using the strength of your legs, not your back. Do not lock your knees straight out.  Always ask for help to lift heavy or awkward objects. This information is not intended to replace advice given to you by your health care provider. Make sure you discuss any questions you have with your health care provider. Document Released: 11/05/2005 Document Revised: 07/12/2016 Document Reviewed: 08/17/2015 Elsevier Interactive Patient Education  2017 [employer].

## 2016-11-05 NOTE — Assessment & Plan Note (Signed)
Symptoms and exam consistent with strain/muscle spasm of the lower trapezius/upper back. Treat conservatively with ice/moist heat, home exercise therapy, and start prednisone and Zanaflex. If symptoms worsen or do not improve consider referral to physical therapy. Continue to monitor.

## 2016-11-07 ENCOUNTER — Encounter: Payer: Self-pay | Admitting: Family

## 2016-11-08 ENCOUNTER — Encounter: Payer: Self-pay | Admitting: Family

## 2016-11-22 ENCOUNTER — Encounter: Payer: Self-pay | Admitting: Family

## 2016-11-22 DIAGNOSIS — M62830 Muscle spasm of back: Secondary | ICD-10-CM

## 2016-11-22 DIAGNOSIS — S29012D Strain of muscle and tendon of back wall of thorax, subsequent encounter: Secondary | ICD-10-CM

## 2016-11-22 NOTE — Telephone Encounter (Signed)
Regarding the email, there is no referral in for physical therapy. Please advise

## 2016-11-30 ENCOUNTER — Encounter: Payer: Self-pay | Admitting: Family

## 2016-11-30 MED ORDER — SILDENAFIL CITRATE 100 MG PO TABS: 50.0000 mg | ORAL_TABLET | Freq: Every day | ORAL | 1 refills | Status: DC | PRN

## 2016-12-04 ENCOUNTER — Ambulatory Visit: Payer: No Typology Code available for payment source | Attending: Family | Admitting: Physical Therapy

## 2016-12-04 ENCOUNTER — Encounter: Payer: Self-pay | Admitting: Physical Therapy

## 2016-12-04 DIAGNOSIS — M546 Pain in thoracic spine: Secondary | ICD-10-CM | POA: Diagnosis present

## 2016-12-04 DIAGNOSIS — M545 Low back pain, unspecified: Secondary | ICD-10-CM

## 2016-12-04 DIAGNOSIS — M62830 Muscle spasm of back: Secondary | ICD-10-CM | POA: Insufficient documentation
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
## 2016-12-10 ENCOUNTER — Encounter: Payer: Self-pay | Admitting: Physical Therapy

## 2016-12-10 ENCOUNTER — Ambulatory Visit: Payer: No Typology Code available for payment source | Admitting: Physical Therapy

## 2016-12-10 DIAGNOSIS — M546 Pain in thoracic spine: Secondary | ICD-10-CM

## 2016-12-10 DIAGNOSIS — M545 Low back pain, unspecified: Secondary | ICD-10-CM

## 2016-12-10 DIAGNOSIS — M62830 Muscle spasm of back: Secondary | ICD-10-CM

## 2016-12-10 NOTE — Therapy (Signed)
[facility] [address] [place], [place], [address] Phone: [phone]   Fax:  [phone]  Physical Therapy Treatment  Patient Details  Name: Shawn James MRN: [id number] Date of Birth: September 12, 1981 Referring Provider: Carver Fila  Encounter Date: 12/10/2016      PT End of Session - 12/10/16 0923    Visit Number 2   Date for PT Re-Evaluation 02/01/17   PT Start Time 0845   PT Stop Time 0935   PT Time Calculation (min) 50 min   Activity Tolerance Patient tolerated treatment well   Behavior During Therapy [facility] for tasks assessed/performed      Past Medical History:  Diagnosis Date  . Bursitis   . Chicken pox   . Hypertension   . Migraine   . Obesity     History reviewed. No pertinent surgical history.  There were no vitals filed for this visit.      Subjective Assessment - 12/10/16 0845    Subjective Feels good and took a muscler relaxer earlier due to pain. No soreness from last treatment but still tight and has been doing stretches at home but they hurt his back.    Currently in Pain? No/denies   Pain Score 0-No pain   Pain Location Back                         [facility] Adult PT Treatment/Exercise - 12/10/16 0001      Exercises   Exercises Lumbar;Neck;Shoulder     Neck Exercises: Theraband   Shoulder Extension 15 reps;Red  2 sets   Rows 15 reps;Red  2 sets     Neck Exercises: Standing   Neck Retraction 15 reps  2 sets   Neck Retraction Limitations green ball behind bed   Other Standing Exercises Snow angels 2x15     Lumbar Exercises: Stretches   Passive Hamstring Stretch 30 seconds;3 reps  Both legs   Lower Trunk Rotation 10 seconds;5 reps  both ways      Lumbar Exercises: Aerobic   Stationary Bike Nustep lvl 6 6 mins     Lumbar Exercises: Machines for Strengthening   Other Lumbar Machine Exercise Rows 25# 3x10, lats 35# 2x10     Lumbar Exercises: Seated   Other Seated  Lumbar Exercises Back extension 2x15 b tband     Modalities   Modalities Electrical Stimulation     Moist Heat Therapy   Number Minutes Moist Heat 15 Minutes   Moist Heat Location Lumbar Spine     Electrical Stimulation   Electrical Stimulation Location C/T area   Electrical Stimulation Action IFC   Electrical Stimulation Parameters Supine   Electrical Stimulation Goals Pain                  PT Short Term Goals - 12/04/16 1504      PT SHORT TERM GOAL #1   Title independent with initial HEP   Time 2   Period Weeks   Status New           PT Xue Term Goals - 12/04/16 1505      PT Dershem TERM GOAL #1   Title decrease pain 50%   Time 8   Period Weeks   Status New     PT Melchior TERM GOAL #2   Title increase lumbar ROM 25%   Time 8   Period Weeks   Status New  PT Cahoon TERM GOAL #3   Title understand proper posture and body mechanics   Time 8   Period Weeks   Status New     PT Mirabella TERM GOAL #4   Title report no difficulty with jobs and able to perform normal job   Time 8   Period Weeks   Status New               Plan - 12/10/16 0924    Clinical Impression Statement Patient tolerated therapy well but at extreme ranges of exercises experienced dull pain in C/T region. Patient was instructed to go to the point right before pain for the remaining reps. Patient needed mod. VC on correct posture and correct technique, but at the end patient stated he had no overall increase in pain which could be d/t the muscle relaxer taken prior to therapy. Patients hamstrings and trunk rotation were very tight and painful.    Rehab Potential Good   PT Frequency 2x / week   PT Duration 8 weeks   PT Treatment/Interventions ADLs/Self Care Home Management;Cryotherapy;Electrical Stimulation;Moist Heat;Ultrasound;Patient/family education;Therapeutic exercise;Therapeutic activities;Manual techniques   PT Next Visit Plan Increase weight on exercsies and increase ranges  of exercises. Continue with flexiblility.      Patient will benefit from skilled therapeutic intervention in order to improve the following deficits and impairments:  Decreased mobility, Decreased strength, Postural dysfunction, Improper body mechanics, Impaired flexibility, Pain, Increased muscle spasms, Decreased range of motion  Visit Diagnosis: No diagnosis found.     Problem List Patient Active Problem List   Diagnosis Date Noted  . Strain of mid-back 11/05/2016  . Lumbar paraspinal muscle spasm 11/05/2016  . Chlamydia contact, treated 12/19/2015  . Exposure to STD 12/05/2015  . Bilateral shoulder bursitis 12/24/2014  . Decreased range of motion (ROM) of shoulder 12/09/2014  . Essential hypertension 12/09/2014    Phillips OdorBrittany Kamarrion Stfort, Leda GauzeSPTA 12/10/2016, 9:37 AM  [facility] [address] [place], [place], [address] Phone: [phone]   Fax:  [phone]  Name: Jhonnie GarnerBrandon Mogel MRN: [id number] Date of Birth: 1980/12/23

## 2016-12-13 ENCOUNTER — Encounter: Payer: Self-pay | Admitting: Family

## 2016-12-14 ENCOUNTER — Ambulatory Visit: Payer: No Typology Code available for payment source | Admitting: Physical Therapy

## 2016-12-14 ENCOUNTER — Encounter: Payer: Self-pay | Admitting: Physical Therapy

## 2016-12-14 DIAGNOSIS — M546 Pain in thoracic spine: Secondary | ICD-10-CM

## 2016-12-14 DIAGNOSIS — M545 Low back pain, unspecified: Secondary | ICD-10-CM

## 2016-12-14 DIAGNOSIS — M62830 Muscle spasm of back: Secondary | ICD-10-CM

## 2016-12-14 NOTE — Therapy (Cosign Needed)
[place], [place], [address] Phone: [phone]   Fax:  [phone]  Physical Therapy Treatment  Patient Details  Name: Shawn James MRN: [id number] Date of Birth: 18-Jan-1981 Referring Provider: Elna Breslow  Encounter Date: 12/14/2016      PT End of Session - 12/14/16 0927    Visit Number 3   Date for PT Re-Evaluation 02/01/17   PT Start Time 0843   PT Stop Time 0938   PT Time Calculation (min) 55 min   Activity Tolerance Patient tolerated treatment well   Behavior During Therapy [facility] for tasks assessed/performed      Past Medical History:  Diagnosis Date  . Bursitis   . Chicken pox   . Hypertension   . Migraine   . Obesity     History reviewed. No pertinent surgical history.  There were no vitals filed for this visit.      Subjective Assessment - 12/14/16 0842    Subjective LB is bothering him possibly due to extreme walking. Took IBU before treatment. SKTC is hurting him at home.    Currently in Pain? Yes   Pain Score 6    Pain Location Back   Pain Orientation Lower                         [facility] Adult PT Treatment/Exercise - 12/14/16 0001      Neck Exercises: Theraband   Shoulder Extension 15 reps;Red   Rows 15 reps;Red     Neck Exercises: Standing   Neck Retraction 15 reps   Neck Retraction Limitations green ball behind head   Other Standing Exercises Snow angels 2x15  with 2# DB     Lumbar Exercises: Stretches   Passive Hamstring Stretch 30 seconds;3 reps   Lower Trunk Rotation 10 seconds;5 reps     Lumbar Exercises: Aerobic   Stationary Bike Nustep lvl 6 6 mins     Lumbar Exercises: Machines for Strengthening   Cybex Knee Extension 35# 2X10   Cybex Knee Flexion 35# 2X10   Other Lumbar Machine Exercise Rows 35# 2x15, lats 35# 2x10     Lumbar Exercises: Standing   Other Standing Lumbar Exercises blue tband standing core tightening 2x30secs                 PT Education - 12/14/16 0926    Education provided Yes   Education Details Seated hamstring stretch   Person(s) Educated Patient   Methods Explanation;Demonstration   Comprehension Verbalized understanding;Returned demonstration          PT Short Term Goals - 12/14/16 0847      PT SHORT TERM GOAL #1   Title independent with initial HEP   Status Achieved           PT Ortmann Term Goals - 12/04/16 1505      PT Gautreau TERM GOAL #1   Title decrease pain 50%   Time 8   Period Weeks   Status New     PT Marsolek TERM GOAL #2   Title increase lumbar ROM 25%   Time 8   Period Weeks   Status New     PT Alford TERM GOAL #3   Title understand proper posture and body mechanics   Time 8   Period Weeks   Status New     PT Delehanty TERM GOAL #4   Title report no difficulty with jobs and  able to perform normal job   Time 8   Period Weeks   Status New               Plan - 12/14/16 7017    Clinical Impression Statement Pateint tolerated treatment well but came in today with increased pain, pain did not increase overall during treatment. Squatting trunk rotation was attempted but sharp pain was felt in lower back. and with other exercises at end ranes patient had sharp pain. Educated patient on seated hamstring stretch due to tightness and pain. STG met.   Rehab Potential Good   PT Frequency 2x / week   PT Duration 8 weeks   PT Treatment/Interventions ADLs/Self Care Home Management;Cryotherapy;Electrical Stimulation;Moist Heat;Ultrasound;Patient/family education;Therapeutic exercise;Therapeutic activities;Manual techniques   PT Next Visit Plan Increase weight on exercsies and increase ranges of exercises. Continue with flexiblility.   Consulted and Agree with Plan of Care Patient      Patient will benefit from skilled therapeutic intervention in order to improve the following deficits and impairments:  Decreased mobility, Decreased strength, Postural  dysfunction, Improper body mechanics, Impaired flexibility, Pain, Increased muscle spasms, Decreased range of motion  Visit Diagnosis: Pain in thoracic spine  Acute midline low back pain without sciatica  Muscle spasm of back     Problem List Patient Active Problem List   Diagnosis Date Noted  . Strain of mid-back 11/05/2016  . Lumbar paraspinal muscle spasm 11/05/2016  . Chlamydia contact, treated 12/19/2015  . Exposure to STD 12/05/2015  . Bilateral shoulder bursitis 12/24/2014  . Decreased range of motion (ROM) of shoulder 12/09/2014  . Essential hypertension 12/09/2014    Devoria Albe, [place] 12/14/2016, 9:41 AM  [place], [place], [address] Phone: [phone]   Fax:  [phone]  Name: Shawn James MRN: [id number] Date of Birth: [birth year]

## 2016-12-14 NOTE — Therapy (Signed)
Shawn James [place], [place], [address] Phone: [phone]   Fax:  [phone]  Physical Therapy Treatment  Patient Details  Name: Shawn James MRN: [id number] Date of Birth: 09-27-1981 Referring Provider: Elna Breslow  Encounter Date: 12/14/2016      PT End of Session - 12/14/16 0927    Visit Number 3   Date for PT Re-Evaluation 02/01/17   PT Start Time 0843   PT Stop Time 0938   PT Time Calculation (min) 55 min   Activity Tolerance Patient tolerated treatment well   Behavior During Therapy [facility] for tasks assessed/performed      Past Medical History:  Diagnosis Date  . Bursitis   . Chicken pox   . Hypertension   . Migraine   . Obesity     History reviewed. No pertinent surgical history.  There were no vitals filed for this visit.      Subjective Assessment - 12/14/16 0842    Subjective LB is bothering him possibly due to extreme walking. Took IBU before treatment. SKTC is hurting him at home.    Currently in Pain? Yes   Pain Score 6    Pain Location Back   Pain Orientation Lower                         [facility] Adult PT Treatment/Exercise - 12/14/16 0001      Neck Exercises: Theraband   Shoulder Extension 15 reps;Red   Rows 15 reps;Red     Neck Exercises: Standing   Neck Retraction 15 reps   Neck Retraction Limitations green ball behind head   Other Standing Exercises Snow angels 2x15  with 2# DB     Lumbar Exercises: Stretches   Passive Hamstring Stretch 30 seconds;3 reps   Lower Trunk Rotation 10 seconds;5 reps     Lumbar Exercises: Aerobic   Stationary Bike Nustep lvl 6 6 mins     Lumbar Exercises: Machines for Strengthening   Cybex Knee Extension 35# 2X10   Cybex Knee Flexion 35# 2X10   Other Lumbar Machine Exercise Rows 35# 2x15, lats 35# 2x10     Lumbar Exercises: Standing   Other Standing Lumbar Exercises blue tband standing core tightening 2x30secs     Moist Heat Therapy   Number Minutes Moist Heat 15 Minutes   Moist Heat Location Lumbar Spine     Electrical Stimulation   Electrical Stimulation Location T/L Area   Electrical Stimulation Action IFC   Electrical Stimulation Parameters Supine   Electrical Stimulation Goals Pain                PT Education - 12/14/16 0926    Education provided Yes   Education Details Seated hamstring stretch   Person(s) Educated Patient   Methods Explanation;Demonstration   Comprehension Verbalized understanding;Returned demonstration          PT Short Term Goals - 12/14/16 0847      PT SHORT TERM GOAL #1   Title independent with initial HEP   Status Achieved           PT Tes[place] Term Goals - 12/04/16 1505      PT Ransome TERM GOAL #1   Title decrease pain 50%   Time 8   Period Weeks   Status New     PT Winborne TERM GOAL #2   Title increase lumbar ROM 25%   Time 8   Period Weeks  Status New     PT Morrill TERM GOAL #3   Title understand proper posture and body mechanics   Time 8   Period Weeks   Status New     PT Koffler TERM GOAL #4   Title report no difficulty with jobs and able to perform normal job   Time 8   Period Weeks   Status New               Plan - 12/14/16 3790    Clinical Impression Statement Pateint tolerated treatment well but came in today with increased pain, pain did not increase overall during treatment. Squatting trunk rotation was attempted but sharp pain was felt in lower back. and with other exercises at end ranes patient had sharp pain. Educated patient on seated hamstring stretch due to tightness and pain. Patient needed max VC on correct posture and technique. STG met.   Rehab Potential Good   PT Frequency 2x / week   PT Duration 8 weeks   PT Treatment/Interventions ADLs/Self Care Home Management;Cryotherapy;Electrical Stimulation;Moist Heat;Ultrasound;Patient/family education;Therapeutic exercise;Therapeutic activities;Manual techniques    PT Next Visit Plan Increase weight on exercsies and increase ranges of exercises. Continue with flexiblility.   Consulted and Agree with Plan of Care Patient      Patient will benefit from skilled therapeutic intervention in order to improve the following deficits and impairments:  Decreased mobility, Decreased strength, Postural dysfunction, Improper body mechanics, Impaired flexibility, Pain, Increased muscle spasms, Decreased range of motion  Visit Diagnosis: Pain in thoracic spine  Acute midline low back pain without sciatica  Muscle spasm of back     Problem List Patient Active Problem List   Diagnosis Date Noted  . Strain of mid-back 11/05/2016  . Lumbar paraspinal muscle spasm 11/05/2016  . Chlamydia contact, treated 12/19/2015  . Exposure to STD 12/05/2015  . Bilateral shoulder bursitis 12/24/2014  . Decreased range of motion (ROM) of shoulder 12/09/2014  . Essential hypertension 12/09/2014    Devoria Albe, [place] 12/14/2016, 10:00 AM  [place], [place], [address] Phone: [phone]   Fax:  [phone]  Name: Shawn James MRN: [id number] Date of Birth: 1981/02/22

## 2016-12-17 ENCOUNTER — Other Ambulatory Visit: Payer: Self-pay | Admitting: Family

## 2016-12-17 ENCOUNTER — Ambulatory Visit: Payer: No Typology Code available for payment source | Admitting: Physical Therapy

## 2016-12-17 ENCOUNTER — Encounter: Payer: Self-pay | Admitting: Physical Therapy

## 2016-12-17 DIAGNOSIS — M546 Pain in thoracic spine: Secondary | ICD-10-CM

## 2016-12-17 DIAGNOSIS — M545 Low back pain, unspecified: Secondary | ICD-10-CM

## 2016-12-17 DIAGNOSIS — M62830 Muscle spasm of back: Secondary | ICD-10-CM

## 2016-12-17 DIAGNOSIS — R3915 Urgency of urination: Secondary | ICD-10-CM

## 2016-12-17 NOTE — Therapy (Signed)
[facility] [address] [place], [place], [address] Phone: [phone]   Fax:  [phone]  Physical Therapy Treatment  Patient Details  Name: Shawn James MRN: [id number] Date of Birth: Mar 21, 1981 Referring Provider: Carver Filaalone  Encounter Date: 12/17/2016      PT End of Session - 12/17/16 0911    Visit Number 4   Date for PT Re-Evaluation 02/01/17   PT Start Time 0844   PT Stop Time 0933   PT Time Calculation (min) 49 min   Activity Tolerance Patient tolerated treatment well   Behavior During Therapy [facility] for tasks assessed/performed      Past Medical History:  Diagnosis Date  . Bursitis   . Chicken pox   . Hypertension   . Migraine   . Obesity     History reviewed. No pertinent surgical history.  There were no vitals filed for this visit.      Subjective Assessment - 12/17/16 0846    Subjective reports sore and stiff today   Currently in Pain? Yes   Pain Score 5    Pain Location Back   Pain Orientation Lower   Pain Descriptors / Indicators Aching   Pain Relieving Factors the heat and estim helps "for awhile"                         [facility] Adult [facility] Treatment/Exercise - 12/17/16 0001      Lumbar Exercises: Aerobic   Stationary Bike Nustep lvl 6 6 mins     Lumbar Exercises: Machines for Strengthening   Cybex Knee Extension 35# 2X10   Cybex Knee Flexion 35# 2X10   Leg Press 40# 2x15   Other Lumbar Machine Exercise Rows 35# 2x15, lats 35# 2x10     Lumbar Exercises: Standing   Other Standing Lumbar Exercises blue tband standing core tightening 2x30secs     Lumbar Exercises: Supine   Large Ball Abdominal Isometric 2 seconds;20 reps   Other Supine Lumbar Exercises feet on ball K2C, small rotation and bridges     Moist Heat Therapy   Number Minutes Moist Heat 15 Minutes   Moist Heat Location Lumbar Spine     Electrical Stimulation   Electrical Stimulation Location T/L Area   Electrical Stimulation Action IFC   Electrical Stimulation Parameters supine   Electrical Stimulation Goals Pain                  PT Short Term Goals - 12/14/16 0847      PT SHORT TERM GOAL #1   Title independent with initial HEP   Status Achieved           PT Gasner Term Goals - 12/17/16 0913      PT Ruybal TERM GOAL #1   Title decrease pain 50%   Status On-going               Plan - 12/17/16 0912    Clinical Impression Statement No increase of pain with todays treatment.  Doing well iwth the HEP, demonstrated good flexibility today   PT Next Visit Plan Increase weight on exercsies and increase ranges of exercises. Continue with flexiblility.   Consulted and Agree with Plan of Care Patient      Patient will benefit from skilled therapeutic intervention in order to improve the following deficits and impairments:  Decreased mobility, Decreased strength, Postural dysfunction, Improper body mechanics, Impaired flexibility, Pain, Increased muscle spasms, Decreased  range of motion  Visit Diagnosis: Pain in thoracic spine  Acute midline low back pain without sciatica  Muscle spasm of back     Problem List Patient Active Problem List   Diagnosis Date Noted  . Strain of mid-back 11/05/2016  . Lumbar paraspinal muscle spasm 11/05/2016  . Chlamydia contact, treated 12/19/2015  . Exposure to STD 12/05/2015  . Bilateral shoulder bursitis 12/24/2014  . Decreased range of motion (ROM) of shoulder 12/09/2014  . Essential hypertension 12/09/2014    Jearld Lesch., PT 12/17/2016, 9:14 AM  [facility] [address] [place], [place], [address] Phone: [phone]   Fax:  [phone]  Name: Shawn James MRN: [id number] Date of Birth: 01/03/1981

## 2016-12-31 ENCOUNTER — Encounter: Payer: Self-pay | Admitting: Family

## 2017-01-05 ENCOUNTER — Encounter: Payer: Self-pay | Admitting: Family

## 2017-01-05 DIAGNOSIS — M62830 Muscle spasm of back: Secondary | ICD-10-CM

## 2017-01-05 DIAGNOSIS — S29012D Strain of muscle and tendon of back wall of thorax, subsequent encounter: Secondary | ICD-10-CM

## 2017-01-17 ENCOUNTER — Encounter: Payer: Self-pay | Admitting: Family

## 2017-02-08 ENCOUNTER — Encounter: Payer: Self-pay | Admitting: Family

## 2017-02-08 DIAGNOSIS — I10 Essential (primary) hypertension: Secondary | ICD-10-CM

## 2017-02-08 MED ORDER — LISINOPRIL-HYDROCHLOROTHIAZIDE 20-12.5 MG PO TABS: 2.0000 | ORAL_TABLET | Freq: Every day | ORAL | 0 refills | Status: DC

## 2017-02-08 MED ORDER — AMLODIPINE BESYLATE 10 MG PO TABS: 10.0000 mg | ORAL_TABLET | Freq: Every day | ORAL | 0 refills | Status: DC

## 2017-02-28 ENCOUNTER — Other Ambulatory Visit: Payer: Self-pay | Admitting: Orthopedic Surgery

## 2017-02-28 DIAGNOSIS — R52 Pain, unspecified: Secondary | ICD-10-CM

## 2017-03-12 ENCOUNTER — Ambulatory Visit
Admission: RE | Admit: 2017-03-12 | Discharge: 2017-03-12 | Disposition: A | Discharge status: Home / Self Care | Payer: Self-pay | Source: Ambulatory Visit | Attending: Orthopedic Surgery | Admitting: Orthopedic Surgery

## 2017-03-12 DIAGNOSIS — R52 Pain, unspecified: Secondary | ICD-10-CM

## 2017-04-30 ENCOUNTER — Other Ambulatory Visit: Payer: Self-pay | Admitting: Family

## 2017-07-24 ENCOUNTER — Other Ambulatory Visit: Payer: Self-pay | Admitting: Family

## 2017-07-24 DIAGNOSIS — I10 Essential (primary) hypertension: Secondary | ICD-10-CM

## 2017-09-20 ENCOUNTER — Other Ambulatory Visit: Payer: Self-pay | Admitting: *Deleted

## 2017-09-20 DIAGNOSIS — M62830 Muscle spasm of back: Secondary | ICD-10-CM

## 2017-09-20 DIAGNOSIS — S29012D Strain of muscle and tendon of back wall of thorax, subsequent encounter: Secondary | ICD-10-CM

## 2017-09-26 ENCOUNTER — Other Ambulatory Visit: Payer: Self-pay | Admitting: *Deleted

## 2017-09-26 DIAGNOSIS — M62830 Muscle spasm of back: Secondary | ICD-10-CM

## 2017-09-26 DIAGNOSIS — S29012D Strain of muscle and tendon of back wall of thorax, subsequent encounter: Secondary | ICD-10-CM

## 2017-12-23 ENCOUNTER — Other Ambulatory Visit: Payer: Self-pay | Admitting: Family

## 2017-12-23 DIAGNOSIS — I10 Essential (primary) hypertension: Secondary | ICD-10-CM

## 2017-12-26 ENCOUNTER — Other Ambulatory Visit: Payer: Self-pay | Admitting: Family

## 2017-12-26 ENCOUNTER — Encounter: Payer: Self-pay | Admitting: Family

## 2017-12-26 ENCOUNTER — Telehealth: Payer: Self-pay | Admitting: Nurse Practitioner

## 2017-12-26 DIAGNOSIS — I10 Essential (primary) hypertension: Secondary | ICD-10-CM

## 2017-12-26 NOTE — Telephone Encounter (Signed)
Pt asking if he can have a refill of Lisinopril until appt with [facility] on 03/12/18. [facility] [employer] pt.

## 2017-12-26 NOTE — Telephone Encounter (Signed)
Copied from CRM [phone]. Topic: Quick Communication - Rx Refill/Question >> Dec 26, 2017  3:15 PM Diana EvesHoyt, Maryann B wrote: Medication: lisinopril   Preferred Pharmacy (with phone number or street name): [id number] - , [place] - [address]  [facility] at first available.   Agent: Please be advised that RX refills may take up to 3 business days. We ask that you follow-up with your pharmacy.

## 2017-12-27 MED ORDER — LISINOPRIL-HYDROCHLOROTHIAZIDE 20-12.5 MG PO TABS: 2.0000 | ORAL_TABLET | Freq: Every day | ORAL | 0 refills | Status: DC

## 2017-12-27 NOTE — Telephone Encounter (Signed)
Per office policy sent enough medication to local pharmacy until appt...Raechel Chute/lmb

## 2018-01-02 ENCOUNTER — Emergency Department (HOSPITAL_BASED_OUTPATIENT_CLINIC_OR_DEPARTMENT_OTHER)
Admission: EM | Admit: 2018-01-02 | Discharge: 2018-01-02 | Disposition: A | Discharge status: Home / Self Care | Payer: PRIVATE HEALTH INSURANCE | Attending: Emergency Medicine | Admitting: Emergency Medicine

## 2018-01-02 ENCOUNTER — Other Ambulatory Visit: Payer: Self-pay

## 2018-01-02 ENCOUNTER — Encounter (HOSPITAL_BASED_OUTPATIENT_CLINIC_OR_DEPARTMENT_OTHER): Payer: Self-pay | Admitting: Emergency Medicine

## 2018-01-02 DIAGNOSIS — L729 Follicular cyst of the skin and subcutaneous tissue, unspecified: Secondary | ICD-10-CM | POA: Insufficient documentation

## 2018-01-02 DIAGNOSIS — L089 Local infection of the skin and subcutaneous tissue, unspecified: Secondary | ICD-10-CM | POA: Insufficient documentation

## 2018-01-02 DIAGNOSIS — I10 Essential (primary) hypertension: Secondary | ICD-10-CM | POA: Insufficient documentation

## 2018-01-02 DIAGNOSIS — Z87891 Personal history of nicotine dependence: Secondary | ICD-10-CM | POA: Diagnosis not present

## 2018-01-02 DIAGNOSIS — Z79899 Other long term (current) drug therapy: Secondary | ICD-10-CM | POA: Insufficient documentation

## 2018-01-02 DIAGNOSIS — R220 Localized swelling, mass and lump, head: Secondary | ICD-10-CM | POA: Diagnosis present

## 2018-01-02 MED ORDER — LIDOCAINE-EPINEPHRINE 2 %-1:100000 IJ SOLN
20.0000 mL | Freq: Once | INTRAMUSCULAR | Status: DC
  Filled 2018-01-02: qty 1

## 2018-01-02 NOTE — ED Provider Notes (Signed)
[facility] EMERGENCY DEPARTMENT Provider Note   CSN: [id number] Arrival date & time: 01/02/18  0813     History   Chief Complaint Chief Complaint  Patient presents with  . Cyst    HPI Shawn James is a 37 y.o. male.  37 year old male with past medical history below who presents with cyst on head.  Patient states that he has had a cyst on the right side of his head for the past 1 month.  It has not bothered him until recently.  Over the past few days he has had worsening tenderness and pain in the area where it was not tender previously.  Pain is worse with palpation.  No drainage and no fevers.   The history is provided by the patient.    Past Medical History:  Diagnosis Date  . Bursitis   . Chicken pox   . Hypertension   . Migraine   . Obesity     Patient Active Problem List   Diagnosis Date Noted  . Strain of mid-back 11/05/2016  . Lumbar paraspinal muscle spasm 11/05/2016  . Chlamydia contact, treated 12/19/2015  . Exposure to STD 12/05/2015  . Bilateral shoulder bursitis 12/24/2014  . Decreased range of motion (ROM) of shoulder 12/09/2014  . Essential hypertension 12/09/2014    History reviewed. No pertinent surgical history.     Home Medications    Prior to Admission medications   Medication Sig Start Date End Date Taking? Authorizing Provider  amLODipine (NORVASC) 10 MG tablet Take 1 tablet (10 mg total) by mouth daily. 02/08/17   Veryl Speakalone, Gregory D, FNP  CIALIS 5 MG tablet TAKE ONE TABLET BY MOUTH ONCE DAILY AS NEEDED FOR  ERECTILE  DYSFUNCTION 05/01/17   Veryl Speakalone, Gregory D, FNP  HYDROcodone-acetaminophen (NORCO) 5-325 MG tablet Take 1-2 tablets by mouth every 6 (six) hours as needed. 05/03/16   Molpus, John, MD  ibuprofen (ADVIL,MOTRIN) 600 MG tablet Take 1 tablet (600 mg total) by mouth every 6 (six) hours as needed. 10/29/16   Bethel BornGekas, Kelly Marie, PA-C  lisinopril-hydrochlorothiazide (PRINZIDE,ZESTORETIC) 20-12.5 MG tablet Take 2 tablets by  mouth daily. Must keep April appt w/new provider for refills 12/27/17   Evaristo BuryShambley, Ashleigh N, NP  predniSONE (STERAPRED UNI-PAK 21 TAB) 10 MG (21) TBPK tablet Take 6 tablets x 1 day, 5 tablets x 1 day, 4 tablets x 1 day, 3 tablets x 1 day, 2 tablets x 1 day, 1 tablet x 1 day 11/05/16   Veryl Speakalone, Gregory D, FNP  sildenafil (VIAGRA) 100 MG tablet Take 0.5-1 tablets (50-100 mg total) by mouth daily as needed for erectile dysfunction. 11/30/16   Veryl Speakalone, Gregory D, FNP  tiZANidine (ZANAFLEX) 4 MG tablet Take 1 tablet (4 mg total) by mouth every 6 (six) hours as needed for muscle spasms. 11/05/16   Veryl Speakalone, Gregory D, FNP    Family History History reviewed. No pertinent family history.  Social History Social History   Tobacco Use  . Smoking status: Former Smoker    Years: 2.00    Types: Cigars  . Smokeless tobacco: Never Used  Substance Use Topics  . Alcohol use: Yes    Alcohol/week: 0.6 oz    Types: 1 Cans of beer per week    Comment: socially  . Drug use: No     Allergies   Patient has no known allergies.   Review of Systems Review of Systems All other systems reviewed and are negative except that which was mentioned in HPI   Physical  Exam Updated Vital Signs BP (!) 137/91 (BP Location: Right Arm)   Pulse 80   Temp 98.2 F (36.8 C) (Oral)   Resp 18   Ht 5\' 11"  (1.803 m)   Wt 117.9 kg (260 lb)   SpO2 100%   BMI 36.26 kg/m   Physical Exam  Constitutional: He is oriented to person, place, and time. He appears well-developed and well-nourished. No distress.  HENT:  Nose: Nose normal.  0.5cm superficial cyst like mass on R scalp just anterior to top of ear within hair, mildly tender to palpation but no erythema or drainage  Eyes: Conjunctivae are normal.  Neck: Neck supple.  Neurological: He is alert and oriented to person, place, and time.  Skin: Skin is warm and dry.  Psychiatric: He has a normal mood and affect. Judgment normal.  Nursing note and vitals  reviewed.    ED Treatments / Results  Labs (all labs ordered are listed, but only abnormal results are displayed) Labs Reviewed - No data to display  EKG  EKG Interpretation None       Radiology No results found.  Procedures .Marland KitchenIncision and Drainage Date/Time: 01/02/2018 9:41 AM Performed by: Laurence Spates, MD Authorized by: Laurence Spates, MD   Consent:    Consent obtained:  Verbal   Consent given by:  Patient   Risks discussed:  Bleeding, infection, incomplete drainage and pain (scarring)   Alternatives discussed:  No treatment Location:    Type:  Cyst   Size:  0.5 cm   Location:  Head   Head location:  Scalp Pre-procedure details:    Skin preparation:  Betadine Anesthesia (see MAR for exact dosages):    Anesthesia method:  Local infiltration   Local anesthetic:  Lidocaine 2% WITH epi Procedure type:    Complexity:  Simple Procedure details:    Incision types:  Single straight   Incision depth:  Dermal   Scalpel blade:  11   Wound management:  Irrigated with saline and probed and deloculated   Drainage characteristics: thick white sebaceous material.   Drainage amount:  Moderate   Wound treatment:  Wound left open   Packing materials:  None Post-procedure details:    Patient tolerance of procedure:  Tolerated well, no immediate complications   (including critical care time)  Medications Ordered in ED Medications  lidocaine-EPINEPHrine (XYLOCAINE W/EPI) 2 %-1:100000 (with pres) injection 20 mL (not administered)     Initial Impression / Assessment and Plan / ED Course  I have reviewed the triage vital signs and the nursing notes.       Given mass has been present for a month and only recently painful, concern for infected cyst. US shows superficial fluid collection. Recommended I&D, discussed risks and benefits. Typical sebaceous cyst material on drainage, given no surrounding erythema I do not feel he needs antibiotics.  Discussed  supportive measures and clinic follow-up for removal of cyst pocket.  Return precautions reviewed.  Final Clinical Impressions(s) / ED Diagnoses   Final diagnoses:  Infected cyst of skin    ED Discharge Orders    None       Drequan Ironside, Ambrose Finland, MD 01/02/18 [phone]

## 2018-01-02 NOTE — ED Triage Notes (Signed)
Reports cyst to right side of head x 1 month.  Tenderness on palpation, no erythema, no drainage.

## 2018-03-04 ENCOUNTER — Encounter: Payer: Self-pay | Admitting: Nurse Practitioner

## 2018-03-05 ENCOUNTER — Other Ambulatory Visit: Payer: Self-pay | Admitting: Nurse Practitioner

## 2018-03-12 ENCOUNTER — Encounter

## 2018-03-12 ENCOUNTER — Ambulatory Visit: Payer: 59 | Admitting: Nurse Practitioner

## 2018-03-14 ENCOUNTER — Other Ambulatory Visit (INDEPENDENT_AMBULATORY_CARE_PROVIDER_SITE_OTHER): Payer: PRIVATE HEALTH INSURANCE

## 2018-03-14 ENCOUNTER — Ambulatory Visit (INDEPENDENT_AMBULATORY_CARE_PROVIDER_SITE_OTHER): Payer: PRIVATE HEALTH INSURANCE | Admitting: Nurse Practitioner

## 2018-03-14 ENCOUNTER — Encounter: Payer: Self-pay | Admitting: Nurse Practitioner

## 2018-03-14 VITALS — BP 152/108 | HR 82 | Temp 98.6°F | Resp 18 | Ht 71.0 in | Wt 281.0 lb

## 2018-03-14 DIAGNOSIS — I10 Essential (primary) hypertension: Secondary | ICD-10-CM | POA: Diagnosis not present

## 2018-03-14 DIAGNOSIS — N529 Male erectile dysfunction, unspecified: Secondary | ICD-10-CM

## 2018-03-14 DIAGNOSIS — R05 Cough: Secondary | ICD-10-CM | POA: Diagnosis not present

## 2018-03-14 DIAGNOSIS — R059 Cough, unspecified: Secondary | ICD-10-CM

## 2018-03-14 LAB — COMPREHENSIVE METABOLIC PANEL
ALT: 44 U/L (ref 0–53)
AST: 31 U/L (ref 0–37)
Albumin: 4.4 g/dL (ref 3.5–5.2)
Alkaline Phosphatase: 53 U/L (ref 39–117)
BILIRUBIN TOTAL: 0.5 mg/dL (ref 0.2–1.2)
BUN: 13 mg/dL (ref 6–23)
CO2: 30 mEq/L (ref 19–32)
CREATININE: 1.13 mg/dL (ref 0.40–1.50)
Calcium: 9.7 mg/dL (ref 8.4–10.5)
Chloride: 98 mEq/L (ref 96–112)
GFR: 94.68 mL/min (ref >60.00)
GLUCOSE: 97 mg/dL (ref 70–99)
Potassium: 3.8 mEq/L (ref 3.5–5.1)
SODIUM: 140 meq/L (ref 135–145)
Total Protein: 7.5 g/dL (ref 6.0–8.3)

## 2018-03-14 LAB — CBC
HCT: 47.5 % (ref 39.0–52.0)
Hemoglobin: 16.1 g/dL (ref 13.0–17.0)
MCHC: 34 g/dL (ref 30.0–36.0)
MCV: 79.5 fl (ref 78.0–100.0)
PLATELETS: 245 10*3/uL (ref 150.0–400.0)
RBC: 5.97 Mil/uL — AB (ref 4.22–5.81)
RDW: 13.6 % (ref 11.5–15.5)
WBC: 9 10*3/uL (ref 4.0–10.5)

## 2018-03-14 LAB — PSA: PSA: 1.21 ng/mL (ref 0.10–4.00)

## 2018-03-14 MED ORDER — SILDENAFIL CITRATE 100 MG PO TABS: 50.0000 mg | ORAL_TABLET | Freq: Every day | ORAL | 1 refills | Status: DC | PRN

## 2018-03-14 MED ORDER — FLUTICASONE PROPIONATE 50 MCG/ACT NA SUSP: 2.0000 | Freq: Every day | NASAL | 6 refills | Status: DC

## 2018-03-14 MED ORDER — CLONIDINE HCL 0.1 MG PO TABS: 0.1000 mg | ORAL_TABLET | Freq: Every day | ORAL | 1 refills | Status: DC

## 2018-03-14 NOTE — Assessment & Plan Note (Signed)
Discussed the role of healthy diet and exercise in the management of chronic conditions including erectile dysfunction Continue viagra prn F/u for new or worsening symptoms - PSA; Future - sildenafil (VIAGRA) 100 MG tablet; Take 0.5-1 tablets (50-100 mg total) by mouth daily as needed for erectile dysfunction.  Dispense: 6 tablet; Refill: 1

## 2018-03-14 NOTE — Assessment & Plan Note (Signed)
BP is quite elevated today with reported medication compliance We discussed continuing current medications and adding clonidine at bedtime- dosing and side effects discussed Discussed the role of healthy diet and exercise in the management of HTN and weight loss, DASH diet, and included instructions and educational information on AVS  Encouraged to monitor BP at home with goal <140/90 RTC in 2 weeks for F/u - Comprehensive metabolic panel; Future - cloNIDine (CATAPRES) 0.1 MG tablet; Take 1 tablet (0.1 mg total) by mouth at bedtime.  Dispense: 30 tablet; Refill: 1 - CBC; Future

## 2018-03-14 NOTE — Progress Notes (Signed)
Name: Shawn James   MRN: [id number]    DOB: March 10, 1981   Date:03/14/2018       Progress Note  Subjective  Chief Complaint  Chief Complaint  Patient presents with  . Establish Care    Dry cough for the past couple of weeks, blood pressure  . Medication Refill    viagra refill    HPI  Hypertension -maintained on Amlodipine 10, lisinopril-HCTZ 20-12.5 2 tablets daily, Reports he takes both medications every morning as directed without noted adverse medication effects. Reports he checks his BP about every 3 days at home but he can not recall the readings. Denies headaches, vision changes, chest pain, shortness of breath, edema.  BP Readings from Last 3 Encounters:  03/14/18 (!) 152/108  01/02/18 (!) 137/91  11/05/16 122/80   Cough- This is an acute problem He thinks he had a cold last week- he had headaches, sinus pressure, sneezing, scratchy throat and cough for several day. All of his symptoms have improved except for his cough- continues to have a cough with some yellow sputum Denies fevers ,weakness, body aches, wheezing. He feels well overall. Has tried delsym with some relief of the cough  Erectile dysfunction- This is not a new problem He says he has suffered from ED since starting lisinopril-HCTZ He takes viagra occasionally with improvement in sexual function He has not noted any adverse effects to the viagra    Patient Active Problem List   Diagnosis Date Noted  . Strain of mid-back 11/05/2016  . Lumbar paraspinal muscle spasm 11/05/2016  . Chlamydia contact, treated 12/19/2015  . Exposure to STD 12/05/2015  . Bilateral shoulder bursitis 12/24/2014  . Decreased range of motion (ROM) of shoulder 12/09/2014  . Essential hypertension 12/09/2014    No past surgical history on file.  No family history on file.  Social History   Socioeconomic History  . Marital status: Single    Spouse name: Not on file  . Number of children: 1  . Years of education: 20   . Highest education level: Not on file  Occupational History  . Occupation: Electrical engineer  . Occupation: Copy  . Financial resource strain: Not on file  . Food insecurity:    Worry: Not on file    Inability: Not on file  . Transportation needs:    Medical: Not on file    Non-medical: Not on file  Tobacco Use  . Smoking status: Former Smoker    Years: 2.00    Types: Cigars  . Smokeless tobacco: Never Used  Substance and Sexual Activity  . Alcohol use: Yes    Alcohol/week: 0.6 oz    Types: 1 Cans of beer per week    Comment: socially  . Drug use: No  . Sexual activity: Not on file  Lifestyle  . Physical activity:    Days per week: Not on file    Minutes per session: Not on file  . Stress: Not on file  Relationships  . Social connections:    Talks on phone: Not on file    Gets together: Not on file    Attends religious service: Not on file    Active member of club or organization: Not on file    Attends meetings of clubs or organizations: Not on file    Relationship status: Not on file  . Intimate partner violence:    Fear of current or ex partner: Not on file    Emotionally abused: Not  on file    Physically abused: Not on file    Forced sexual activity: Not on file  Other Topics Concern  . Not on file  Social History Narrative   [place], [place] Currently lives in an apartment with by himself. No pets. Fun: Movies, eating out, travel   Denies religious beliefs that would effect health care.      Current Outpatient Medications:  .  amLODipine (NORVASC) 10 MG tablet, Take 1 tablet (10 mg total) by mouth daily., Disp: 90 tablet, Rfl: 0 .  ibuprofen (ADVIL,MOTRIN) 600 MG tablet, Take 1 tablet (600 mg total) by mouth every 6 (six) hours as needed., Disp: 30 tablet, Rfl: 0 .  lisinopril-hydrochlorothiazide (PRINZIDE,ZESTORETIC) 20-12.5 MG tablet, Take 2 tablets by mouth daily. Must keep April appt w/new provider for refills,  Disp: 180 tablet, Rfl: 0 .  sildenafil (VIAGRA) 100 MG tablet, Take 0.5-1 tablets (50-100 mg total) by mouth daily as needed for erectile dysfunction., Disp: 6 tablet, Rfl: 1 .  tiZANidine (ZANAFLEX) 4 MG tablet, Take 1 tablet (4 mg total) by mouth every 6 (six) hours as needed for muscle spasms., Disp: 30 tablet, Rfl: 1 .  cloNIDine (CATAPRES) 0.1 MG tablet, Take 1 tablet (0.1 mg total) by mouth at bedtime., Disp: 30 tablet, Rfl: 1 .  fluticasone (FLONASE) 50 MCG/ACT nasal spray, Place 2 sprays into both nostrils daily., Disp: 16 g, Rfl: 6  No Known Allergies   ROS See HPI  Objective  Vitals:   03/14/18 0952  BP: (!) 152/108  Pulse: 82  Resp: 18  Temp: 98.6 F (37 C)  TempSrc: Oral  SpO2: 97%  Weight: 281 lb (127.5 kg)  Height: 5\' 11"  (1.803 m)    Body mass index is 39.19 kg/m.  Physical Exam VItal signs reviewed. Constitutional: Patient appears well-developed and well-nourished. Obese. No distress.  HENT: Head: Normocephalic and atraumatic. Ears: Bilateral TMs without erythema or effusion; Nose: Nose normal. Mouth/Throat: Oropharynx is clear and moist. No oropharyngeal exudate.  Eyes: Conjunctivae and EOM are normal. Pupils are equal, round, and reactive to light. No scleral icterus.  Neck: Normal range of motion. Neck supple. No cervical adenopathy. Cardiovascular: Normal rate, regular rhythm and normal heart sounds.  No BLE edema. Distal pulses intact. Pulmonary/Chest: Effort normal and breath sounds normal. No respiratory distress. Neurological: He is alert and oriented to person, place, and time.  Coordination, balance, strength, speech and gait are normal.  Skin: Skin is warm and dry. No rash noted. No erythema.  Psychiatric: Patient has a normal mood and affect. behavior is normal. Judgment and thought content normal.  Assessment & Plan RTC in 2 weeks for F/U: HTN- adding amlodipine; Cough-starting flonase, OTC antihistamine  Cough Will try flonase and OTC  antihistamine for possible allergies-dosing and side effects discussed RTC in 2 weeks for follow up May need to consider lisinopril as a source if cough persists - fluticasone (FLONASE) 50 MCG/ACT nasal spray; Place 2 sprays into both nostrils daily.  Dispense: 16 g; Refill: 6

## 2018-03-14 NOTE — Patient Instructions (Addendum)
Please head downstairs for lab work/x-rays. If any of your test results are critically abnormal, you will be contacted right away. Your results may be released to your MyChart for viewing before I am able to provide you with my response. I will contact you within a week about your test results and any recommendations for abnormalities.  I have sent a prescription for flonase nasal spray to your pharmacy- you may start with 2 sprays in each nostril daily then reduce to 1 spray in each nostril daily when your symptoms improve You may also take an over the counter allergy medication such as claritin or zyrtec for your symptoms.  For your blood pressure, please continue taking your amlodipine and lisinopril-HCTZ every morning Please start taking clonidine 0.1mg  at bedtime. Please continue to monitor your blood pressure at home, we need to get your blood pressure below 140/90   Please return in about 2 weeks so I can see how your cough and blood pressure is doing    DASH Eating Plan DASH stands for "Dietary Approaches to Stop Hypertension." The DASH eating plan is a healthy eating plan that has been shown to reduce high blood pressure (hypertension). It may also reduce your risk for type 2 diabetes, heart disease, and stroke. The DASH eating plan may also help with weight loss. What are tips for following this plan? General guidelines  Avoid eating more than 2,300 mg (milligrams) of salt (sodium) a day. If you have hypertension, you may need to reduce your sodium intake to 1,500 mg a day.  Limit alcohol intake to no more than 1 drink a day for nonpregnant women and 2 drinks a day for men. One drink equals 12 oz of beer, 5 oz of wine, or 1 oz of hard liquor.  Work with your health care provider to maintain a healthy body weight or to lose weight. Ask what an ideal weight is for you.  Get at least 30 minutes of exercise that causes your heart to beat faster (aerobic exercise) most days of the  week. Activities may include walking, swimming, or biking.  Work with your health care provider or diet and nutrition specialist (dietitian) to adjust your eating plan to your individual calorie needs. Reading food labels  Check food labels for the amount of sodium per serving. Choose foods with less than 5 percent of the Daily Value of sodium. Generally, foods with less than 300 mg of sodium per serving fit into this eating plan.  To find whole grains, look for the word "whole" as the first word in the ingredient list. Shopping  Buy products labeled as "low-sodium" or "no salt added."  Buy fresh foods. Avoid canned foods and premade or frozen meals. Cooking  Avoid adding salt when cooking. Use salt-free seasonings or herbs instead of table salt or sea salt. Check with your health care provider or pharmacist before using salt substitutes.  Do not fry foods. Cook foods using healthy methods such as baking, boiling, grilling, and broiling instead.  Cook with heart-healthy oils, such as olive, canola, soybean, or sunflower oil. Meal planning   Eat a balanced diet that includes: ? 5 or more servings of fruits and vegetables each day. At each meal, try to fill half of your plate with fruits and vegetables. ? Up to 6-8 servings of whole grains each day. ? Less than 6 oz of lean meat, poultry, or fish each day. A 3-oz serving of meat is about the same size as a deck  of cards. One egg equals 1 oz. ? 2 servings of low-fat dairy each day. ? A serving of nuts, seeds, or beans 5 times each week. ? Heart-healthy fats. Healthy fats called Omega-3 fatty acids are found in foods such as flaxseeds and coldwater fish, like sardines, salmon, and mackerel.  Limit how much you eat of the following: ? Canned or prepackaged foods. ? Food that is high in trans fat, such as fried foods. ? Food that is high in saturated fat, such as fatty meat. ? Sweets, desserts, sugary drinks, and other foods with added  sugar. ? Full-fat dairy products.  Do not salt foods before eating.  Try to eat at least 2 vegetarian meals each week.  Eat more home-cooked food and less restaurant, buffet, and fast food.  When eating at a restaurant, ask that your food be prepared with less salt or no salt, if possible. What foods are recommended? The items listed may not be a complete list. Talk with your dietitian about what dietary choices are best for you. Grains Whole-grain or whole-wheat bread. Whole-grain or whole-wheat pasta. Brown rice. [place]. Bulgur. Whole-grain and low-sodium cereals. Pita bread. Low-fat, low-sodium crackers. Whole-wheat flour tortillas. Vegetables Fresh or frozen vegetables (raw, steamed, roasted, or grilled). Low-sodium or reduced-sodium tomato and vegetable juice. Low-sodium or reduced-sodium tomato sauce and tomato paste. Low-sodium or reduced-sodium canned vegetables. Fruits All fresh, dried, or frozen fruit. Canned fruit in natural juice (without added sugar). Meat and other protein foods Skinless chicken or [place]. Ground chicken or [place]. Pork with fat trimmed off. Fish and seafood. Egg whites. Dried beans, peas, or lentils. Unsalted nuts, nut butters, and seeds. Unsalted canned beans. Lean cuts of beef with fat trimmed off. Low-sodium, lean deli meat. Dairy Low-fat (1%) or fat-free (skim) milk. Fat-free, low-fat, or reduced-fat cheeses. Nonfat, low-sodium ricotta or cottage cheese. Low-fat or nonfat yogurt. Low-fat, low-sodium cheese. Fats and oils Soft margarine without trans fats. Vegetable oil. Low-fat, reduced-fat, or light mayonnaise and salad dressings (reduced-sodium). Canola, safflower, olive, soybean, and sunflower oils. Avocado. Seasoning and other foods Herbs. Spices. Seasoning mixes without salt. Unsalted popcorn and pretzels. Fat-free sweets. What foods are not recommended? The items listed may not be a complete list. Talk with your dietitian about what  dietary choices are best for you. Grains Baked goods made with fat, such as croissants, muffins, or some breads. Dry pasta or rice meal packs. Vegetables Creamed or fried vegetables. Vegetables in a cheese sauce. Regular canned vegetables (not low-sodium or reduced-sodium). Regular canned tomato sauce and paste (not low-sodium or reduced-sodium). Regular tomato and vegetable juice (not low-sodium or reduced-sodium). Angie Fava. Olives. Fruits Canned fruit in a light or heavy syrup. Fried fruit. Fruit in cream or butter sauce. Meat and other protein foods Fatty cuts of meat. Ribs. Fried meat. Berniece Salines. Sausage. Bologna and other processed lunch meats. Salami. Fatback. Hotdogs. Bratwurst. Salted nuts and seeds. Canned beans with added salt. Canned or smoked fish. Whole eggs or egg yolks. Chicken or [place] with skin. Dairy Whole or 2% milk, cream, and half-and-half. Whole or full-fat cream cheese. Whole-fat or sweetened yogurt. Full-fat cheese. Nondairy creamers. Whipped toppings. Processed cheese and cheese spreads. Fats and oils Butter. Stick margarine. Lard. Shortening. Ghee. Bacon fat. Tropical oils, such as coconut, palm kernel, or palm oil. Seasoning and other foods Salted popcorn and pretzels. Onion salt, garlic salt, seasoned salt, table salt, and sea salt. Worcestershire sauce. Tartar sauce. Barbecue sauce. Teriyaki sauce. Soy sauce, including reduced-sodium. Steak sauce. Canned and  packaged gravies. Fish sauce. Oyster sauce. Cocktail sauce. Horseradish that you find on the shelf. Ketchup. Mustard. Meat flavorings and tenderizers. Bouillon cubes. Hot sauce and Tabasco sauce. Premade or packaged marinades. Premade or packaged taco seasonings. Relishes. Regular salad dressings. Where to find more information:  National Heart, Lung, and Blood Institute: PopSteam.is  American Heart Association: www.heart.org Summary  The DASH eating plan is a healthy eating plan that has been shown to reduce  high blood pressure (hypertension). It may also reduce your risk for type 2 diabetes, heart disease, and stroke.  With the DASH eating plan, you should limit salt (sodium) intake to 2,300 mg a day. If you have hypertension, you may need to reduce your sodium intake to 1,500 mg a day.  When on the DASH eating plan, aim to eat more fresh fruits and vegetables, whole grains, lean proteins, low-fat dairy, and heart-healthy fats.  Work with your health care provider or diet and nutrition specialist (dietitian) to adjust your eating plan to your individual calorie needs. This information is not intended to replace advice given to you by your health care provider. Make sure you discuss any questions you have with your health care provider. Document Released: 10/25/2011 Document Revised: 10/29/2016 Document Reviewed: 10/29/2016 Elsevier Interactive Patient Education  [employer].

## 2018-03-28 ENCOUNTER — Encounter: Payer: Self-pay | Admitting: Nurse Practitioner

## 2018-03-28 ENCOUNTER — Other Ambulatory Visit: Payer: Self-pay

## 2018-03-28 DIAGNOSIS — R059 Cough, unspecified: Secondary | ICD-10-CM

## 2018-03-28 DIAGNOSIS — I10 Essential (primary) hypertension: Secondary | ICD-10-CM

## 2018-03-28 DIAGNOSIS — R05 Cough: Secondary | ICD-10-CM

## 2018-03-28 DIAGNOSIS — N529 Male erectile dysfunction, unspecified: Secondary | ICD-10-CM

## 2018-03-28 MED ORDER — FLUTICASONE PROPIONATE 50 MCG/ACT NA SUSP: 2.0000 | Freq: Every day | NASAL | 6 refills | Status: DC

## 2018-03-28 MED ORDER — CLONIDINE HCL 0.1 MG PO TABS: 0.1000 mg | ORAL_TABLET | Freq: Every day | ORAL | 1 refills | Status: DC

## 2018-03-28 MED ORDER — SILDENAFIL CITRATE 100 MG PO TABS: 50.0000 mg | ORAL_TABLET | Freq: Every day | ORAL | 1 refills | Status: DC | PRN

## 2018-04-08 ENCOUNTER — Ambulatory Visit: Payer: PRIVATE HEALTH INSURANCE | Admitting: Nurse Practitioner

## 2018-04-08 DIAGNOSIS — Z0289 Encounter for other administrative examinations: Secondary | ICD-10-CM

## 2018-05-15 ENCOUNTER — Other Ambulatory Visit: Payer: Self-pay | Admitting: Nurse Practitioner

## 2018-05-15 DIAGNOSIS — I10 Essential (primary) hypertension: Secondary | ICD-10-CM

## 2018-06-05 ENCOUNTER — Encounter: Payer: Self-pay | Admitting: Nurse Practitioner

## 2018-07-04 ENCOUNTER — Encounter: Payer: Self-pay | Admitting: Nurse Practitioner

## 2018-07-07 ENCOUNTER — Encounter: Payer: Self-pay | Admitting: Nurse Practitioner

## 2018-07-08 ENCOUNTER — Other Ambulatory Visit: Payer: Self-pay | Admitting: Nurse Practitioner

## 2018-07-08 MED ORDER — TADALAFIL 5 MG PO TABS: 5.0000 mg | ORAL_TABLET | Freq: Every day | ORAL | 0 refills | Status: DC | PRN

## 2018-07-10 ENCOUNTER — Encounter: Payer: Self-pay | Admitting: Nurse Practitioner

## 2018-07-10 ENCOUNTER — Other Ambulatory Visit (INDEPENDENT_AMBULATORY_CARE_PROVIDER_SITE_OTHER): Payer: PRIVATE HEALTH INSURANCE

## 2018-07-10 ENCOUNTER — Ambulatory Visit (INDEPENDENT_AMBULATORY_CARE_PROVIDER_SITE_OTHER): Payer: PRIVATE HEALTH INSURANCE | Admitting: Nurse Practitioner

## 2018-07-10 VITALS — BP 136/108 | HR 68 | Ht 71.0 in | Wt 279.0 lb

## 2018-07-10 DIAGNOSIS — I10 Essential (primary) hypertension: Secondary | ICD-10-CM | POA: Diagnosis not present

## 2018-07-10 DIAGNOSIS — R0683 Snoring: Secondary | ICD-10-CM | POA: Diagnosis not present

## 2018-07-10 DIAGNOSIS — M2550 Pain in unspecified joint: Secondary | ICD-10-CM

## 2018-07-10 LAB — URIC ACID: Uric Acid, Serum: 7.6 mg/dL (ref 4.0–7.8)

## 2018-07-10 LAB — SEDIMENTATION RATE: SED RATE: 42 mm/h — AB (ref 0–15)

## 2018-07-10 LAB — C-REACTIVE PROTEIN: CRP: 0.5 mg/dL (ref 0.5–20.0)

## 2018-07-10 NOTE — Assessment & Plan Note (Addendum)
BP elevated off medications Discussed the role of healthy diet and exercise, daily medication compliance in the management of HTN and printed additional information on AVS Encouraged to monitor BP at home with goal <140/90 RTC in 2 weeks for F/U- recheck BP reading

## 2018-07-10 NOTE — Patient Instructions (Addendum)
Please head downstairs for lab work If any of your test results are critically abnormal, you will be contacted right away. Otherwise, I will contact you within a week about your test results and follow up recommendations  Please resume your blood pressure medications daily as prescribed Please return in about 2 weeks so I can recheck your blood pressure  Referrals ordered: Pulmonology for sleep evaluation   DASH Eating Plan DASH stands for "Dietary Approaches to Stop Hypertension." The DASH eating plan is a healthy eating plan that has been shown to reduce high blood pressure (hypertension). It may also reduce your risk for type 2 diabetes, heart disease, and stroke. The DASH eating plan may also help with weight loss. What are tips for following this plan? General guidelines  Avoid eating more than 2,300 mg (milligrams) of salt (sodium) a day. If you have hypertension, you may need to reduce your sodium intake to 1,500 mg a day.  Limit alcohol intake to no more than 1 drink a day for nonpregnant women and 2 drinks a day for men. One drink equals 12 oz of beer, 5 oz of wine, or 1 oz of hard liquor.  Work with your health care provider to maintain a healthy body weight or to lose weight. Ask what an ideal weight is for you.  Get at least 30 minutes of exercise that causes your heart to beat faster (aerobic exercise) most days of the week. Activities may include walking, swimming, or biking.  Work with your health care provider or diet and nutrition specialist (dietitian) to adjust your eating plan to your individual calorie needs. Reading food labels  Check food labels for the amount of sodium per serving. Choose foods with less than 5 percent of the Daily Value of sodium. Generally, foods with less than 300 mg of sodium per serving fit into this eating plan.  To find whole grains, look for the word "whole" as the first word in the ingredient list. Shopping  Buy products labeled as  "low-sodium" or "no salt added."  Buy fresh foods. Avoid canned foods and premade or frozen meals. Cooking  Avoid adding salt when cooking. Use salt-free seasonings or herbs instead of table salt or sea salt. Check with your health care provider or pharmacist before using salt substitutes.  Do not fry foods. Cook foods using healthy methods such as baking, boiling, grilling, and broiling instead.  Cook with heart-healthy oils, such as olive, canola, soybean, or sunflower oil. Meal planning   Eat a balanced diet that includes: ? 5 or more servings of fruits and vegetables each day. At each meal, try to fill half of your plate with fruits and vegetables. ? Up to 6-8 servings of whole grains each day. ? Less than 6 oz of lean meat, poultry, or fish each day. A 3-oz serving of meat is about the same size as a deck of cards. One egg equals 1 oz. ? 2 servings of low-fat dairy each day. ? A serving of nuts, seeds, or beans 5 times each week. ? Heart-healthy fats. Healthy fats called Omega-3 fatty acids are found in foods such as flaxseeds and coldwater fish, like sardines, salmon, and mackerel.  Limit how much you eat of the following: ? Canned or prepackaged foods. ? Food that is high in trans fat, such as fried foods. ? Food that is high in saturated fat, such as fatty meat. ? Sweets, desserts, sugary drinks, and other foods with added sugar. ? Full-fat dairy products.  Do not salt foods before eating.  Try to eat at least 2 vegetarian meals each week.  Eat more home-cooked food and less restaurant, buffet, and fast food.  When eating at a restaurant, ask that your food be prepared with less salt or no salt, if possible. What foods are recommended? The items listed may not be a complete list. Talk with your dietitian about what dietary choices are best for you. Grains Whole-grain or whole-wheat bread. Whole-grain or whole-wheat pasta. Brown rice. [place]. Bulgur. Whole-grain  and low-sodium cereals. Pita bread. Low-fat, low-sodium crackers. Whole-wheat flour tortillas. Vegetables Fresh or frozen vegetables (raw, steamed, roasted, or grilled). Low-sodium or reduced-sodium tomato and vegetable juice. Low-sodium or reduced-sodium tomato sauce and tomato paste. Low-sodium or reduced-sodium canned vegetables. Fruits All fresh, dried, or frozen fruit. Canned fruit in natural juice (without added sugar). Meat and other protein foods Skinless chicken or [place]. Ground chicken or [place]. Pork with fat trimmed off. Fish and seafood. Egg whites. Dried beans, peas, or lentils. Unsalted nuts, nut butters, and seeds. Unsalted canned beans. Lean cuts of beef with fat trimmed off. Low-sodium, lean deli meat. Dairy Low-fat (1%) or fat-free (skim) milk. Fat-free, low-fat, or reduced-fat cheeses. Nonfat, low-sodium ricotta or cottage cheese. Low-fat or nonfat yogurt. Low-fat, low-sodium cheese. Fats and oils Soft margarine without trans fats. Vegetable oil. Low-fat, reduced-fat, or light mayonnaise and salad dressings (reduced-sodium). Canola, safflower, olive, soybean, and sunflower oils. Avocado. Seasoning and other foods Herbs. Spices. Seasoning mixes without salt. Unsalted popcorn and pretzels. Fat-free sweets. What foods are not recommended? The items listed may not be a complete list. Talk with your dietitian about what dietary choices are best for you. Grains Baked goods made with fat, such as croissants, muffins, or some breads. Dry pasta or rice meal packs. Vegetables Creamed or fried vegetables. Vegetables in a cheese sauce. Regular canned vegetables (not low-sodium or reduced-sodium). Regular canned tomato sauce and paste (not low-sodium or reduced-sodium). Regular tomato and vegetable juice (not low-sodium or reduced-sodium). Angie Fava. Olives. Fruits Canned fruit in a light or heavy syrup. Fried fruit. Fruit in cream or butter sauce. Meat and other protein foods Fatty cuts  of meat. Ribs. Fried meat. Berniece Salines. Sausage. Bologna and other processed lunch meats. Salami. Fatback. Hotdogs. Bratwurst. Salted nuts and seeds. Canned beans with added salt. Canned or smoked fish. Whole eggs or egg yolks. Chicken or [place] with skin. Dairy Whole or 2% milk, cream, and half-and-half. Whole or full-fat cream cheese. Whole-fat or sweetened yogurt. Full-fat cheese. Nondairy creamers. Whipped toppings. Processed cheese and cheese spreads. Fats and oils Butter. Stick margarine. Lard. Shortening. Ghee. Bacon fat. Tropical oils, such as coconut, palm kernel, or palm oil. Seasoning and other foods Salted popcorn and pretzels. Onion salt, garlic salt, seasoned salt, table salt, and sea salt. Worcestershire sauce. Tartar sauce. Barbecue sauce. Teriyaki sauce. Soy sauce, including reduced-sodium. Steak sauce. Canned and packaged gravies. Fish sauce. Oyster sauce. Cocktail sauce. Horseradish that you find on the shelf. Ketchup. Mustard. Meat flavorings and tenderizers. Bouillon cubes. Hot sauce and Tabasco sauce. Premade or packaged marinades. Premade or packaged taco seasonings. Relishes. Regular salad dressings. Where to find more information:  National Heart, Lung, and [place]: https://wilson-eaton.com/  American Heart Association: www.heart.org Summary  The DASH eating plan is a healthy eating plan that has been shown to reduce high blood pressure (hypertension). It may also reduce your risk for type 2 diabetes, heart disease, and stroke.  With the DASH eating plan, you should limit salt (sodium)  intake to 2,300 mg a day. If you have hypertension, you may need to reduce your sodium intake to 1,500 mg a day.  When on the DASH eating plan, aim to eat more fresh fruits and vegetables, whole grains, lean proteins, low-fat dairy, and heart-healthy fats.  Work with your health care provider or diet and nutrition specialist (dietitian) to adjust your eating plan to your individual calorie  needs. This information is not intended to replace advice given to you by your health care provider. Make sure you discuss any questions you have with your health care provider. Document Released: 10/25/2011 Document Revised: 10/29/2016 Document Reviewed: 10/29/2016 Elsevier Interactive Patient Education  [employer].

## 2018-07-10 NOTE — Progress Notes (Signed)
Name: Shawn James   MRN: [id number]    DOB: 20-May-1981   Date:07/10/2018       Progress Note  Subjective  Chief Complaint  Snoring, joint pain, hypertension  HPI  Shawn James is here today requesting evaluation of snoring and joint pain. We will also discuss his elevated blood pressure.  Hypertension -maintained on Amlodipine 10, lisinopril-HCTZ BID for some time and Clonidine 0.1 at bedtime was added at last OV on 4/26 due to continued elevated BP readings- I asked him to return in 2 weeks for follow up of blood pressure at but he did not make it back until today. Reports he has added clonidine as instructed and takes medications daily as prescribed until yesterday, when he missed all of his medication due to running out and needing to go pick up refills He has not been checking his BP readings at home recently Denies confusion, vision changes, chest pain, edema.  BP Readings from Last 3 Encounters:  07/10/18 (!) 136/108  03/14/18 (!) 152/108  01/02/18 (!) 137/91   Snoring- He has been snoring for years, but household members have told him that his snoring is getting louder and it often sounds like he stops breathing and gets chocked during his sleep He has not noticed shortness of breath, choking. He says he often feels tired, but does feel like he gets good sleep at night, sleeps about 6 hours per night. He occasionally experiences headaches when waking in the am. Not a smoker He is concerned for sleep apnea and would like a referral for sleep study today  Joint pain- This is a new problem He c/o bilateral knee and left hip pain over the past few weeks,describes as aching pain. He denies fevers, swelling, erythema. He does not recall any heavy exertion or injury prior to onset of pain. Hes tried icing painful joints at home with no relief Hes noticed the pain is worse after standing for Shawn James periods.   Patient Active Problem List   Diagnosis Date Noted  . Erectile dysfunction  03/14/2018  . Chlamydia contact, treated 12/19/2015  . Bilateral shoulder bursitis 12/24/2014  . Essential hypertension 12/09/2014    History reviewed. No pertinent surgical history.  History reviewed. No pertinent family history.  Social History   Socioeconomic History  . Marital status: Single    Spouse name: Not on file  . Number of children: 1  . Years of education: 47  . Highest education level: Not on file  Occupational History  . Occupation: Electrical engineer  . Occupation: Copy  . Financial resource strain: Not on file  . Food insecurity:    Worry: Not on file    Inability: Not on file  . Transportation needs:    Medical: Not on file    Non-medical: Not on file  Tobacco Use  . Smoking status: Former Smoker    Years: 2.00    Types: Cigars  . Smokeless tobacco: Never Used  Substance and Sexual Activity  . Alcohol use: Yes    Alcohol/week: 1.0 standard drinks    Types: 1 Cans of beer per week    Comment: socially  . Drug use: No  . Sexual activity: Not on file  Lifestyle  . Physical activity:    Days per week: Not on file    Minutes per session: Not on file  . Stress: Not on file  Relationships  . Social connections:    Talks on phone: Not on file  Gets together: Not on file    Attends religious service: Not on file    Active member of club or organization: Not on file    Attends meetings of clubs or organizations: Not on file    Relationship status: Not on file  . Intimate partner violence:    Fear of current or ex partner: Not on file    Emotionally abused: Not on file    Physically abused: Not on file    Forced sexual activity: Not on file  Other Topics Concern  . Not on file  Social History Narrative   Born and raised in [place], [place]. Currently lives in an apartment with by himself. No pets. Fun: Movies, eating out, travel   Denies religious beliefs that would effect health care.      Current Outpatient Medications:   .  amLODipine (NORVASC) 10 MG tablet, Take 1 tablet (10 mg total) by mouth daily., Disp: 90 tablet, Rfl: 0 .  cloNIDine (CATAPRES) 0.1 MG tablet, Take 1 tablet (0.1 mg total) by mouth at bedtime., Disp: 30 tablet, Rfl: 1 .  fluticasone (FLONASE) 50 MCG/ACT nasal spray, Place 2 sprays into both nostrils daily., Disp: 16 g, Rfl: 6 .  ibuprofen (ADVIL,MOTRIN) 600 MG tablet, Take 1 tablet (600 mg total) by mouth every 6 (six) hours as needed., Disp: 30 tablet, Rfl: 0 .  lisinopril-hydrochlorothiazide (PRINZIDE,ZESTORETIC) 20-12.5 MG tablet, TAKE 2 TABLETS BY MOUTH ONCE DAILY *MUST  KEEP  APRIL  APPOINTMENT  WITH  NEW  PROVIDER  FOR  REFILLS*, Disp: 180 tablet, Rfl: 0 .  tadalafil (CIALIS) 5 MG tablet, Take 1 tablet (5 mg total) by mouth daily as needed for erectile dysfunction., Disp: 10 tablet, Rfl: 0 .  tiZANidine (ZANAFLEX) 4 MG tablet, Take 1 tablet (4 mg total) by mouth every 6 (six) hours as needed for muscle spasms., Disp: 30 tablet, Rfl: 1  No Known Allergies   ROS See HPI  Objective  Vitals:   07/10/18 0847  BP: (!) 136/108  Pulse: 68  SpO2: 95%  Weight: 279 lb (126.6 kg)  Height: 5\' 11"  (1.803 m)    Body mass index is 38.91 kg/m.  Physical Exam Vital signs reviewed Constitutional: Patient appears well-developed and well-nourished. No distress.  HENT: Head: Normocephalic and atraumatic.   Nose: Nose normal. Mouth/Throat: Oropharynx is clear and moist. No oropharyngeal exudate.  Eyes: Conjunctivae and EOM are normal. Pupils are equal, round, and reactive to light. No scleral icterus.  Neck: Normal range of motion. Neck supple.  Cardiovascular: Normal rate, regular rhythm and normal heart sounds.  No murmur heard. No BLE edema. Distal pulses intact. Pulmonary/Chest: Effort normal and breath sounds normal. No respiratory distress. Musculoskeletal: Normal range of motion, no joint effusions. No gross deformities. Joints are not swollen, erythematous or tender to  touch. Neurological: He is alert and oriented to person, place, and time. No cranial nerve deficit. Coordination, balance, strength, speech and gait are normal.  Skin: Skin is warm and dry. No rash noted.  Psychiatric: Patient has a normal mood and affect. behavior is normal. Judgment and thought content normal.   Assessment & Plan RTC in 2 weeks for F/U- recheck BP reading  -Reviewed Health Maintenance: up to date  Snoring - Ambulatory referral to Pulmonology  Arthralgia, unspecified joint Weight is likely playing a role in his joint pain - we discussed healthy diet and exercise today Labs ordered F/U with further recommendations pending lab results We discussed referral to sports medicine for  further evaluation of joint pain if labs are normal - Uric acid; Future - ANA; Future - Sedimentation rate; Future - Rheumatoid Factor; Future - C-reactive protein; Future

## 2018-07-14 LAB — RHEUMATOID FACTOR

## 2018-07-14 LAB — ANTI-NUCLEAR AB-TITER (ANA TITER)

## 2018-07-14 LAB — ANA: Anti Nuclear Antibody(ANA): POSITIVE — AB

## 2018-07-28 ENCOUNTER — Encounter: Payer: Self-pay | Admitting: Nurse Practitioner

## 2018-07-28 ENCOUNTER — Ambulatory Visit (INDEPENDENT_AMBULATORY_CARE_PROVIDER_SITE_OTHER): Payer: PRIVATE HEALTH INSURANCE | Admitting: Nurse Practitioner

## 2018-07-28 VITALS — BP 126/98 | HR 77 | Ht 71.0 in | Wt 278.0 lb

## 2018-07-28 DIAGNOSIS — I10 Essential (primary) hypertension: Secondary | ICD-10-CM | POA: Diagnosis not present

## 2018-07-28 DIAGNOSIS — Z23 Encounter for immunization: Secondary | ICD-10-CM

## 2018-07-28 DIAGNOSIS — L853 Xerosis cutis: Secondary | ICD-10-CM | POA: Diagnosis not present

## 2018-07-28 DIAGNOSIS — M2550 Pain in unspecified joint: Secondary | ICD-10-CM

## 2018-07-28 DIAGNOSIS — E669 Obesity, unspecified: Secondary | ICD-10-CM | POA: Diagnosis not present

## 2018-07-28 DIAGNOSIS — Z6838 Body mass index (BMI) 38.0-38.9, adult: Secondary | ICD-10-CM

## 2018-07-28 MED ORDER — TRIAMCINOLONE ACETONIDE 0.1 % EX CREA: 1.0000 "application " | TOPICAL_CREAM | Freq: Two times a day (BID) | CUTANEOUS | 0 refills | Status: DC

## 2018-07-28 MED ORDER — CLONIDINE HCL 0.1 MG PO TABS: 0.1000 mg | ORAL_TABLET | Freq: Two times a day (BID) | ORAL | 1 refills | Status: DC

## 2018-07-28 NOTE — Progress Notes (Signed)
Name: Shawn James   MRN: [id number]    DOB: 1980/12/08   Date:07/28/2018       Progress Note  Subjective  Chief Complaint Follow up  HPI Mr Coston is here today for follow up of hypertension and joint pain. He is also requesting evaluation of dry patch up skin to his left upper arm, noticed for past several weeks, he has experienced this in the past but normally goes away with OTC hydrocortisone. He has been applying hydrocortisone and aveeno daily but dry skin persists. Denies fevers, erythema, itching, pain, drainage.  Hypertension -maintained on Amlodipine 10 daily,  lisinopril-HCTZ 20-12.5 2 tabs daily, Clonidine 0.1 qhs  At his last OV on 8/22, he had missed his medications for a couple of days so we discussed resuming medications daily and he is back today for a blood pressure recheck. Reports he has resumed all medications daily as instructed, without noted  adverse medication effects. Reports he has been checking his BP readings at home. Recent readings 120s/90s since resuming all medications. Denies headaches, vision changes, chest pain, shortness of breath, edema. Currently awaiting appointment with pulmonology for evaluation of possible sleep apnea.  BP Readings from Last 3 Encounters:  07/28/18 (!) 126/98  07/10/18 (!) 136/108  03/14/18 (!) 152/108   Joint pain- he told me at his last OV on 07/10/18 he was having bilateral knee and left hip pain, aching, for several weeks . We checked labs, ANA was positive and ESR slightly elevated but otherwise labs were unremarkable.  Patient Active Problem List   Diagnosis Date Noted  . Erectile dysfunction 03/14/2018  . Chlamydia contact, treated 12/19/2015  . Bilateral shoulder bursitis 12/24/2014  . Essential hypertension 12/09/2014    History reviewed. No pertinent surgical history.  History reviewed. No pertinent family history.  Social History   Socioeconomic History  . Marital status: Single    Spouse name: Not on file  .  Number of children: 1  . Years of education: 21  . Highest education level: Not on file  Occupational History  . Occupation: Presenter, broadcasting  . Occupation: Lobbyist  . Financial resource strain: Not on file  . Food insecurity:    Worry: Not on file    Inability: Not on file  . Transportation needs:    Medical: Not on file    Non-medical: Not on file  Tobacco Use  . Smoking status: Former Smoker    Years: 2.00    Types: Cigars  . Smokeless tobacco: Never Used  Substance and Sexual Activity  . Alcohol use: Yes    Alcohol/week: 1.0 standard drinks    Types: 1 Cans of beer per week    Comment: socially  . Drug use: No  . Sexual activity: Not on file  Lifestyle  . Physical activity:    Days per week: Not on file    Minutes per session: Not on file  . Stress: Not on file  Relationships  . Social connections:    Talks on phone: Not on file    Gets together: Not on file    Attends religious service: Not on file    Active member of club or organization: Not on file    Attends meetings of clubs or organizations: Not on file    Relationship status: Not on file  . Intimate partner violence:    Fear of current or ex partner: Not on file    Emotionally abused: Not on file  Physically abused: Not on file    Forced sexual activity: Not on file  Other Topics Concern  . Not on file  Social History Narrative   Born and raised in [place], [place]. Currently lives in an apartment with by himself. No pets. Fun: Movies, eating out, travel   Denies religious beliefs that would effect health care.      Current Outpatient Medications:  .  amLODipine (NORVASC) 10 MG tablet, Take 1 tablet (10 mg total) by mouth daily., Disp: 90 tablet, Rfl: 0 .  cloNIDine (CATAPRES) 0.1 MG tablet, Take 1 tablet (0.1 mg total) by mouth at bedtime., Disp: 30 tablet, Rfl: 1 .  fluticasone (FLONASE) 50 MCG/ACT nasal spray, Place 2 sprays into both nostrils daily., Disp: 16 g, Rfl: 6 .   ibuprofen (ADVIL,MOTRIN) 600 MG tablet, Take 1 tablet (600 mg total) by mouth every 6 (six) hours as needed., Disp: 30 tablet, Rfl: 0 .  lisinopril-hydrochlorothiazide (PRINZIDE,ZESTORETIC) 20-12.5 MG tablet, TAKE 2 TABLETS BY MOUTH ONCE DAILY *MUST  KEEP  APRIL  APPOINTMENT  WITH  NEW  PROVIDER  FOR  REFILLS*, Disp: 180 tablet, Rfl: 0 .  tadalafil (CIALIS) 5 MG tablet, Take 1 tablet (5 mg total) by mouth daily as needed for erectile dysfunction., Disp: 10 tablet, Rfl: 0 .  tiZANidine (ZANAFLEX) 4 MG tablet, Take 1 tablet (4 mg total) by mouth every 6 (six) hours as needed for muscle spasms., Disp: 30 tablet, Rfl: 1  No Known Allergies   ROS See HPI  Objective  Vitals:   07/28/18 0837  BP: (!) 126/98  Pulse: 77  SpO2: 94%  Weight: 278 lb (126.1 kg)  Height: 5' 11"  (1.803 m)    Body mass index is 38.77 kg/m.  Physical Exam Vital signs reviewed Constitutional: Patient appears well-developed and well-nourished. No distress.  HENT: Head: Normocephalic and atraumatic.   Nose: Nose normal. Mouth/Throat: Oropharynx is clear and moist. No oropharyngeal exudate.  Eyes: Conjunctivae and EOM are normal. Pupils are equal, round, and reactive to light. No scleral icterus.  Neck: Normal range of motion. Neck supple.  Cardiovascular: Normal rate, regular rhythm and normal heart sounds.  No murmur heard. No BLE edema. Distal pulses intact. Pulmonary/Chest: Effort normal and breath sounds normal. No respiratory distress. Neurological: He is alert and oriented to person, place, and time. No cranial nerve deficit. Coordination, balance, strength, speech and gait are normal.  Skin: Skin is warm and dry. Dry skin noted to left upper posterior arm. Psychiatric: Patient has a normal mood and affect. behavior is normal. Judgment and thought content normal.  Assessment & Plan RTC in 2 weeks for F/U: HTN- increasing clonidine dosage  -Reviewed Health Maintenance:  Need for influenza vaccination- Flu  Vaccine QUAD 36+ mos IM  Dry skin Dicussed home management of dry skin including return precautions Kenalog Rx sent-dosing and side effects dicussed F/u fo rnew, worsening or persistent symptoms - triamcinolone cream (KENALOG) 0.1 %; Apply 1 application topically 2 (two) times daily.  Dispense: 30 g; Refill: 0  Class 2 obesity with body mass index (BMI) of 38.0 to 38.9 in adult, unspecified obesity type, unspecified whether serious comorbidity present Discussed options to manage joint pain including weight loss, sports med evaluation, nutrition and/or weight management referral, he is interested in nutrition referral Will plan to repeat ANA, ESR in about 3 months for trend - Ambulatory referral to Nutrition and Diabetic Education  Arthralgia, unspecified joint Discussed options to manage joint pain including weight loss, sports med  evaluation, nutrition and/or weight management referral, he is interested in nutrition referral Will plan to repeat ANA, ESR in about 3 months for trend

## 2018-07-28 NOTE — Patient Instructions (Signed)
Please increase your clonidine to 01.mg twice daily. Please return in about 2 weeks for follow up,so I can recheck your blood pressure and see how you are doing on the medication adjustment   Hypertension Hypertension is another name for high blood pressure. High blood pressure forces your heart to work harder to pump blood. This can cause problems over time. There are two numbers in a blood pressure reading. There is a top number (systolic) over a bottom number (diastolic). It is best to have a blood pressure below 120/80. Healthy choices can help lower your blood pressure. You may need medicine to help lower your blood pressure if:  Your blood pressure cannot be lowered with healthy choices.  Your blood pressure is higher than 130/80.  Follow these instructions at home: Eating and drinking  If directed, follow the DASH eating plan. This diet includes: ? Filling half of your plate at each meal with fruits and vegetables. ? Filling one quarter of your plate at each meal with whole grains. Whole grains include whole wheat pasta, brown rice, and whole grain bread. ? Eating or drinking low-fat dairy products, such as skim milk or low-fat yogurt. ? Filling one quarter of your plate at each meal with low-fat (lean) proteins. Low-fat proteins include fish, skinless chicken, eggs, beans, and tofu. ? Avoiding fatty meat, cured and processed meat, or chicken with skin. ? Avoiding premade or processed food.  Eat less than 1,500 mg of salt (sodium) a day.  Limit alcohol use to no more than 1 drink a day for nonpregnant women and 2 drinks a day for men. One drink equals 12 oz of beer, 5 oz of wine, or 1 oz of hard liquor. Lifestyle  Work with your doctor to stay at a healthy weight or to lose weight. Ask your doctor what the best weight is for you.  Get at least 30 minutes of exercise that causes your heart to beat faster (aerobic exercise) most days of the week. This may include walking,  swimming, or biking.  Get at least 30 minutes of exercise that strengthens your muscles (resistance exercise) at least 3 days a week. This may include lifting weights or pilates.  Do not use any products that contain nicotine or tobacco. This includes cigarettes and e-cigarettes. If you need help quitting, ask your doctor.  Check your blood pressure at home as told by your doctor.  Keep all follow-up visits as told by your doctor. This is important. Medicines  Take over-the-counter and prescription medicines only as told by your doctor. Follow directions carefully.  Do not skip doses of blood pressure medicine. The medicine does not work as well if you skip doses. Skipping doses also puts you at risk for problems.  Ask your doctor about side effects or reactions to medicines that you should watch for. Contact a doctor if:  You think you are having a reaction to the medicine you are taking.  You have headaches that keep coming back (recurring).  You feel dizzy.  You have swelling in your ankles.  You have trouble with your vision. Get help right away if:  You get a very bad headache.  You start to feel confused.  You feel weak or numb.  You feel faint.  You get very bad pain in your: ? Chest. ? Belly (abdomen).  You throw up (vomit) more than once.  You have trouble breathing. Summary  Hypertension is another name for high blood pressure.  Making healthy choices can  help lower blood pressure. If your blood pressure cannot be controlled with healthy choices, you may need to take medicine. This information is not intended to replace advice given to you by your health care provider. Make sure you discuss any questions you have with your health care provider. Document Released: 04/23/2008 Document Revised: 10/03/2016 Document Reviewed: 10/03/2016 Elsevier Interactive Patient Education  [employer].

## 2018-07-28 NOTE — Assessment & Plan Note (Signed)
BP remains elevated back on medications Continue amlodipine, lisinopril-HCTZ at current dosages and INCREASE clonidine to BID Continue to monitor home BP readings Continue with pulmonology evaluation for sleep apnea, referral to nutrition education today for weight loss, additional education provided on AVS RTC in 2 weeks for F/U - Ambulatory referral to Nutrition and Diabetic Education - cloNIDine (CATAPRES) 0.1 MG tablet; Take 1 tablet (0.1 mg total) by mouth 2 (two) times daily.  Dispense: 180 tablet; Refill: 1

## 2018-08-15 ENCOUNTER — Ambulatory Visit: Payer: PRIVATE HEALTH INSURANCE | Admitting: Nurse Practitioner

## 2018-08-29 ENCOUNTER — Ambulatory Visit: Payer: PRIVATE HEALTH INSURANCE | Admitting: Nurse Practitioner

## 2018-10-25 ENCOUNTER — Other Ambulatory Visit: Payer: Self-pay | Admitting: Nurse Practitioner

## 2018-10-25 DIAGNOSIS — I10 Essential (primary) hypertension: Secondary | ICD-10-CM

## 2018-12-09 ENCOUNTER — Other Ambulatory Visit: Payer: Self-pay | Admitting: Nurse Practitioner

## 2018-12-10 MED ORDER — TADALAFIL 5 MG PO TABS: 5.0000 mg | ORAL_TABLET | Freq: Every day | ORAL | 0 refills | Status: DC | PRN

## 2019-03-09 ENCOUNTER — Other Ambulatory Visit: Payer: Self-pay | Admitting: *Deleted

## 2019-03-09 MED ORDER — TADALAFIL 5 MG PO TABS: 5.0000 mg | ORAL_TABLET | Freq: Every day | ORAL | 0 refills | Status: DC | PRN

## 2019-04-22 ENCOUNTER — Other Ambulatory Visit: Payer: Self-pay | Admitting: Internal Medicine

## 2019-04-22 ENCOUNTER — Other Ambulatory Visit: Payer: Self-pay

## 2019-04-26 ENCOUNTER — Other Ambulatory Visit: Payer: Self-pay

## 2019-05-11 ENCOUNTER — Other Ambulatory Visit: Payer: Self-pay | Admitting: *Deleted

## 2019-05-11 DIAGNOSIS — Z20822 Contact with and (suspected) exposure to covid-19: Secondary | ICD-10-CM

## 2019-05-14 NOTE — Addendum Note (Signed)
Addended by: Erroll Wilbourne M on: 05/14/2019 03:57 PM   Modules accepted: Orders  

## 2019-06-01 ENCOUNTER — Other Ambulatory Visit: Payer: Self-pay

## 2019-06-04 MED ORDER — TADALAFIL 5 MG PO TABS: 5.0000 mg | ORAL_TABLET | Freq: Every day | ORAL | 0 refills | Status: DC | PRN

## 2019-07-09 ENCOUNTER — Other Ambulatory Visit: Payer: Self-pay | Admitting: *Deleted

## 2019-07-09 DIAGNOSIS — I10 Essential (primary) hypertension: Secondary | ICD-10-CM

## 2019-07-09 MED ORDER — LISINOPRIL-HYDROCHLOROTHIAZIDE 20-12.5 MG PO TABS: ORAL_TABLET | ORAL | 0 refills | Status: DC

## 2019-08-30 ENCOUNTER — Other Ambulatory Visit: Payer: Self-pay | Admitting: Internal Medicine

## 2019-08-30 ENCOUNTER — Other Ambulatory Visit: Payer: Self-pay

## 2019-08-30 DIAGNOSIS — I10 Essential (primary) hypertension: Secondary | ICD-10-CM

## 2019-08-31 MED ORDER — LISINOPRIL-HYDROCHLOROTHIAZIDE 20-12.5 MG PO TABS: ORAL_TABLET | ORAL | 0 refills | Status: DC

## 2019-08-31 NOTE — Telephone Encounter (Signed)
He must be seen for OV- this is the last refill on his medications.

## 2019-08-31 NOTE — Telephone Encounter (Signed)
Message sent to patient via my-chart to make appointment within next 30 days to continue receiving refills on his meds.

## 2019-09-02 ENCOUNTER — Other Ambulatory Visit: Payer: Self-pay | Admitting: Internal Medicine

## 2019-09-05 DIAGNOSIS — H348312 Tributary (branch) retinal vein occlusion, right eye, stable: Secondary | ICD-10-CM | POA: Diagnosis not present

## 2019-09-05 DIAGNOSIS — H35033 Hypertensive retinopathy, bilateral: Secondary | ICD-10-CM | POA: Diagnosis not present

## 2019-09-05 DIAGNOSIS — H34231 Retinal artery branch occlusion, right eye: Secondary | ICD-10-CM | POA: Diagnosis not present

## 2019-09-05 DIAGNOSIS — H43822 Vitreomacular adhesion, left eye: Secondary | ICD-10-CM | POA: Diagnosis not present

## 2019-09-11 DIAGNOSIS — H3561 Retinal hemorrhage, right eye: Secondary | ICD-10-CM | POA: Diagnosis not present

## 2019-09-11 DIAGNOSIS — H348311 Tributary (branch) retinal vein occlusion, right eye, with retinal neovascularization: Secondary | ICD-10-CM | POA: Diagnosis not present

## 2019-09-11 DIAGNOSIS — H4311 Vitreous hemorrhage, right eye: Secondary | ICD-10-CM | POA: Diagnosis not present

## 2019-09-11 DIAGNOSIS — H35033 Hypertensive retinopathy, bilateral: Secondary | ICD-10-CM | POA: Diagnosis not present

## 2019-09-21 ENCOUNTER — Encounter: Payer: PRIVATE HEALTH INSURANCE | Admitting: Family

## 2019-09-21 DIAGNOSIS — H348311 Tributary (branch) retinal vein occlusion, right eye, with retinal neovascularization: Secondary | ICD-10-CM | POA: Diagnosis not present

## 2019-09-23 ENCOUNTER — Other Ambulatory Visit: Payer: Self-pay | Admitting: Family

## 2019-09-23 DIAGNOSIS — I10 Essential (primary) hypertension: Secondary | ICD-10-CM

## 2019-09-23 MED ORDER — LISINOPRIL-HYDROCHLOROTHIAZIDE 20-12.5 MG PO TABS: ORAL_TABLET | ORAL | 0 refills | Status: DC

## 2019-09-28 ENCOUNTER — Encounter: Payer: PRIVATE HEALTH INSURANCE | Admitting: Family

## 2019-09-29 ENCOUNTER — Telehealth: Payer: Self-pay | Admitting: Internal Medicine

## 2019-09-29 NOTE — Telephone Encounter (Signed)
Dr Jenny Reichmann, This patient was a previous patient of Caesar Chestnut and is scheduled to transfer care to [facility]. Due to scheduling conflicts with Laura's schedule and the patient mostly needing afternoon appointments, would you be willing to see this patient to establish care as a transfer patient?

## 2019-09-29 NOTE — Telephone Encounter (Signed)
Tired to call patient to inform.  Per Mickel Baas, she would like for patient to transfer care to Dr Jenny Reichmann due to scheduling conflicts with Laura's schedule.  Unable to leave a message, no voicemail set up.

## 2019-09-29 NOTE — Telephone Encounter (Signed)
Ok with me for [facility]

## 2019-10-05 ENCOUNTER — Encounter: Payer: PRIVATE HEALTH INSURANCE | Admitting: Family

## 2019-10-09 ENCOUNTER — Encounter: Payer: Self-pay | Admitting: Family

## 2019-10-09 ENCOUNTER — Other Ambulatory Visit: Payer: Self-pay

## 2019-10-09 ENCOUNTER — Other Ambulatory Visit (INDEPENDENT_AMBULATORY_CARE_PROVIDER_SITE_OTHER): Payer: BC Managed Care – PPO

## 2019-10-09 ENCOUNTER — Ambulatory Visit (INDEPENDENT_AMBULATORY_CARE_PROVIDER_SITE_OTHER): Payer: BC Managed Care – PPO | Admitting: Family

## 2019-10-09 DIAGNOSIS — I10 Essential (primary) hypertension: Secondary | ICD-10-CM

## 2019-10-09 LAB — CBC WITH DIFFERENTIAL/PLATELET
Basophils Absolute: 0 10*3/uL (ref 0.0–0.1)
Basophils Relative: 0.5 % (ref 0.0–3.0)
Eosinophils Absolute: 0.1 10*3/uL (ref 0.0–0.7)
Eosinophils Relative: 0.8 % (ref 0.0–5.0)
HCT: 47.9 % (ref 39.0–52.0)
Hemoglobin: 15.8 g/dL (ref 13.0–17.0)
Lymphocytes Relative: 25.2 % (ref 12.0–46.0)
Lymphs Abs: 1.9 10*3/uL (ref 0.7–4.0)
MCHC: 33.1 g/dL (ref 30.0–36.0)
MCV: 81.4 fl (ref 78.0–100.0)
Monocytes Absolute: 0.4 10*3/uL (ref 0.1–1.0)
Monocytes Relative: 5.2 % (ref 3.0–12.0)
Neutro Abs: 5.1 10*3/uL (ref 1.4–7.7)
Neutrophils Relative %: 68.3 % (ref 43.0–77.0)
Platelets: 235 10*3/uL (ref 150.0–400.0)
RBC: 5.89 Mil/uL — ABNORMAL HIGH (ref 4.22–5.81)
RDW: 13.6 % (ref 11.5–15.5)
WBC: 7.4 10*3/uL (ref 4.0–10.5)

## 2019-10-09 LAB — COMPREHENSIVE METABOLIC PANEL
ALT: 47 U/L (ref 0–53)
AST: 36 U/L (ref 0–37)
Albumin: 4.6 g/dL (ref 3.5–5.2)
Alkaline Phosphatase: 47 U/L (ref 39–117)
BUN: 16 mg/dL (ref 6–23)
CO2: 30 mEq/L (ref 19–32)
Calcium: 9.9 mg/dL (ref 8.4–10.5)
Chloride: 99 mEq/L (ref 96–112)
Creatinine, Ser: 1.17 mg/dL (ref 0.40–1.50)
GFR: 84.83 mL/min (ref >60.00)
Glucose, Bld: 97 mg/dL (ref 70–99)
Potassium: 3.7 mEq/L (ref 3.5–5.1)
Sodium: 137 mEq/L (ref 135–145)
Total Bilirubin: 0.6 mg/dL (ref 0.2–1.2)
Total Protein: 7.9 g/dL (ref 6.0–8.3)

## 2019-10-09 MED ORDER — LISINOPRIL-HYDROCHLOROTHIAZIDE 20-12.5 MG PO TABS: ORAL_TABLET | ORAL | 0 refills | Status: DC

## 2019-10-09 MED ORDER — AMLODIPINE BESYLATE 10 MG PO TABS: 10.0000 mg | ORAL_TABLET | Freq: Every day | ORAL | 0 refills | Status: DC

## 2019-10-09 MED ORDER — CLONIDINE HCL 0.1 MG PO TABS: 0.1000 mg | ORAL_TABLET | Freq: Two times a day (BID) | ORAL | 0 refills | Status: DC

## 2019-10-09 NOTE — Progress Notes (Signed)
Shawn James is a 38 y.o. male with the following history as recorded in EpicCare:  Patient Active Problem List   Diagnosis Date Noted  . Erectile dysfunction 03/14/2018  . Chlamydia contact, treated 12/19/2015  . Bilateral shoulder bursitis 12/24/2014  . Essential hypertension 12/09/2014    Current Outpatient Medications  Medication Sig Dispense Refill  . amLODipine (NORVASC) 10 MG tablet Take 1 tablet (10 mg total) by mouth daily. 90 tablet 0  . cloNIDine (CATAPRES) 0.1 MG tablet Take 1 tablet (0.1 mg total) by mouth 2 (two) times daily. 180 tablet 0  . fluticasone (FLONASE) 50 MCG/ACT nasal spray Place 2 sprays into both nostrils daily. 16 g 6  . ibuprofen (ADVIL,MOTRIN) 600 MG tablet Take 1 tablet (600 mg total) by mouth every 6 (six) hours as needed. 30 tablet 0  . lisinopril-hydrochlorothiazide (ZESTORETIC) 20-12.5 MG tablet TAKE 2 TABLETS BY MOUTH ONCE DAILY 180 tablet 0  . tadalafil (CIALIS) 5 MG tablet TAKE 1 TABLET BY MOUTH ONCE DAILY AS NEEDED FOR ERECTILE DYSFUNCTION 10 tablet 0  . tiZANidine (ZANAFLEX) 4 MG tablet Take 1 tablet (4 mg total) by mouth every 6 (six) hours as needed for muscle spasms. 30 tablet 1  . triamcinolone cream (KENALOG) 0.1 % Apply 1 application topically 2 (two) times daily. 30 g 0   No current facility-administered medications for this visit.     Allergies: Patient has no known allergies.  Past Medical History:  Diagnosis Date  . Bursitis   . Chicken pox   . Hypertension   . Migraine   . Obesity     History reviewed. No pertinent surgical history.  History reviewed. No pertinent family history.  Social History   Tobacco Use  . Smoking status: Former Smoker    Years: 2.00    Types: Cigars  . Smokeless tobacco: Never Used  Substance Use Topics  . Alcohol use: Yes    Alcohol/week: 1.0 standard drinks    Types: 1 Cans of beer per week    Comment: socially    Subjective:  Follow-up on hypertension; has not been seen here in over 1 year;  Denies any chest pain, shortness of breath, blurred vision or headache.  Per patient, he is currently taking Lisinopril HCT 2 per day, Clonidine 0.1 mg bid; notes he is out of his Amlodipine 10 mg daily; in reviewing notes, this appears to have been his previous regimen but refills have been very sporadic; he is adamant that he has been getting his medications regularly;  Patient notes that he lives and works close to the [employer] location and wonders about changing offices;      Objective:  Vitals:   10/09/19 1540  BP: (!) 150/110  Pulse: 88  Temp: 98.3 F (36.8 C)  TempSrc: Oral  SpO2: 97%  Weight: 277 lb 1.3 oz (125.7 kg)  Height: 5' 11"  (1.803 m)    General: Well developed, well nourished, in no acute distress  Skin : Warm and dry.  Head: Normocephalic and atraumatic  Lungs: Respirations unlabored; clear to auscultation bilaterally without wheeze, rales, rhonchi  CVS exam: normal rate and regular rhythm.  Neurologic: Alert and oriented; speech intact; face symmetrical; moves all extremities well; CNII-XII intact without focal deficit   Assessment:  1. Essential hypertension     Plan:   Uncontrolled- ? If patient is taking medication; refills updated as requested; check CBC, CMP today; Patient is planning to transfer to the [place] Location- he needs to follow-up there in  3-4 weeks.  This visit occurred during the SARS-CoV-2 public health emergency.  Safety protocols were in place, including screening questions prior to the visit, additional usage of staff PPE, and extensive cleaning of exam room while observing appropriate contact time as indicated for disinfecting solutions.    Return for TOC to [place].  Orders Placed This Encounter  Procedures  . CBC w/Diff    Standing Status:   Future    Standing Expiration Date:   10/08/2020  . Comp Met (CMET)    Standing Status:   Future    Standing Expiration Date:   10/08/2020    Requested Prescriptions    Signed Prescriptions Disp Refills  . lisinopril-hydrochlorothiazide (ZESTORETIC) 20-12.5 MG tablet 180 tablet 0    Sig: TAKE 2 TABLETS BY MOUTH ONCE DAILY  . cloNIDine (CATAPRES) 0.1 MG tablet 180 tablet 0    Sig: Take 1 tablet (0.1 mg total) by mouth 2 (two) times daily.  Marland Kitchen amLODipine (NORVASC) 10 MG tablet 90 tablet 0    Sig: Take 1 tablet (10 mg total) by mouth daily.

## 2019-10-09 NOTE — Patient Instructions (Signed)
Please schedule a follow up with your new provider at [facility]; I would recommend Wilfred Lacy, NP;

## 2019-10-29 ENCOUNTER — Other Ambulatory Visit: Payer: Self-pay

## 2019-10-30 ENCOUNTER — Other Ambulatory Visit: Payer: Self-pay | Admitting: Internal Medicine

## 2019-10-30 ENCOUNTER — Encounter: Payer: BC Managed Care – PPO | Admitting: Nurse Practitioner

## 2019-11-02 ENCOUNTER — Other Ambulatory Visit: Payer: Self-pay

## 2019-11-02 ENCOUNTER — Encounter: Payer: Self-pay | Admitting: Nurse Practitioner

## 2019-11-02 ENCOUNTER — Ambulatory Visit (INDEPENDENT_AMBULATORY_CARE_PROVIDER_SITE_OTHER): Payer: BC Managed Care – PPO | Admitting: Nurse Practitioner

## 2019-11-02 VITALS — BP 110/88 | HR 76 | Temp 97.7°F | Ht 71.0 in | Wt 285.6 lb

## 2019-11-02 DIAGNOSIS — G4730 Sleep apnea, unspecified: Secondary | ICD-10-CM | POA: Diagnosis not present

## 2019-11-02 DIAGNOSIS — Z6839 Body mass index (BMI) 39.0-39.9, adult: Secondary | ICD-10-CM | POA: Diagnosis not present

## 2019-11-02 DIAGNOSIS — I10 Essential (primary) hypertension: Secondary | ICD-10-CM

## 2019-11-02 MED ORDER — AMLODIPINE BESYLATE 10 MG PO TABS: 10.0000 mg | ORAL_TABLET | Freq: Every day | ORAL | 3 refills | Status: DC

## 2019-11-02 MED ORDER — CLONIDINE HCL 0.1 MG PO TABS: 0.1000 mg | ORAL_TABLET | Freq: Two times a day (BID) | ORAL | 3 refills | Status: DC

## 2019-11-02 MED ORDER — LISINOPRIL-HYDROCHLOROTHIAZIDE 20-12.5 MG PO TABS: ORAL_TABLET | ORAL | 1 refills | Status: DC

## 2019-11-02 NOTE — Patient Instructions (Addendum)
Monitor BP at home 2-3x/week Call office if BP >150/90 for more than 2days.  I encourage weight loss through diet modification and exercise(brisk walking 30-71mins a day)   DASH Eating Plan DASH stands for "Dietary Approaches to Stop Hypertension." The DASH eating plan is a healthy eating plan that has been shown to reduce high blood pressure (hypertension). It may also reduce your risk for type 2 diabetes, heart disease, and stroke. The DASH eating plan may also help with weight loss. What are tips for following this plan?  General guidelines  Avoid eating more than 2,300 mg (milligrams) of salt (sodium) a day. If you have hypertension, you may need to reduce your sodium intake to 1,500 mg a day.  Limit alcohol intake to no more than 1 drink a day for nonpregnant women and 2 drinks a day for men. One drink equals 12 oz of beer, 5 oz of wine, or 1 oz of hard liquor.  Work with your health care provider to maintain a healthy body weight or to lose weight. Ask what an ideal weight is for you.  Get at least 30 minutes of exercise that causes your heart to beat faster (aerobic exercise) most days of the week. Activities may include walking, swimming, or biking.  Work with your health care provider or diet and nutrition specialist (dietitian) to adjust your eating plan to your individual calorie needs. Reading food labels   Check food labels for the amount of sodium per serving. Choose foods with less than 5 percent of the Daily Value of sodium. Generally, foods with less than 300 mg of sodium per serving fit into this eating plan.  To find whole grains, look for the word "whole" as the first word in the ingredient list. Shopping  Buy products labeled as "low-sodium" or "no salt added."  Buy fresh foods. Avoid canned foods and premade or frozen meals. Cooking  Avoid adding salt when cooking. Use salt-free seasonings or herbs instead of table salt or sea salt. Check with your health care  provider or pharmacist before using salt substitutes.  Do not fry foods. Cook foods using healthy methods such as baking, boiling, grilling, and broiling instead.  Cook with heart-healthy oils, such as olive, canola, soybean, or sunflower oil. Meal planning  Eat a balanced diet that includes: ? 5 or more servings of fruits and vegetables each day. At each meal, try to fill half of your plate with fruits and vegetables. ? Up to 6-8 servings of whole grains each day. ? Less than 6 oz of lean meat, poultry, or fish each day. A 3-oz serving of meat is about the same size as a deck of cards. One egg equals 1 oz. ? 2 servings of low-fat dairy each day. ? A serving of nuts, seeds, or beans 5 times each week. ? Heart-healthy fats. Healthy fats called Omega-3 fatty acids are found in foods such as flaxseeds and coldwater fish, like sardines, salmon, and mackerel.  Limit how much you eat of the following: ? Canned or prepackaged foods. ? Food that is high in trans fat, such as fried foods. ? Food that is high in saturated fat, such as fatty meat. ? Sweets, desserts, sugary drinks, and other foods with added sugar. ? Full-fat dairy products.  Do not salt foods before eating.  Try to eat at least 2 vegetarian meals each week.  Eat more home-cooked food and less restaurant, buffet, and fast food.  When eating at a restaurant, ask that  your food be prepared with less salt or no salt, if possible. What foods are recommended? The items listed may not be a complete list. Talk with your dietitian about what dietary choices are best for you. Grains Whole-grain or whole-wheat bread. Whole-grain or whole-wheat pasta. Brown rice. [place]. Bulgur. Whole-grain and low-sodium cereals. Pita bread. Low-fat, low-sodium crackers. Whole-wheat flour tortillas. Vegetables Fresh or frozen vegetables (raw, steamed, roasted, or grilled). Low-sodium or reduced-sodium tomato and vegetable juice. Low-sodium or  reduced-sodium tomato sauce and tomato paste. Low-sodium or reduced-sodium canned vegetables. Fruits All fresh, dried, or frozen fruit. Canned fruit in natural juice (without added sugar). Meat and other protein foods Skinless chicken or [place]. Ground chicken or [place]. Pork with fat trimmed off. Fish and seafood. Egg whites. Dried beans, peas, or lentils. Unsalted nuts, nut butters, and seeds. Unsalted canned beans. Lean cuts of beef with fat trimmed off. Low-sodium, lean deli meat. Dairy Low-fat (1%) or fat-free (skim) milk. Fat-free, low-fat, or reduced-fat cheeses. Nonfat, low-sodium ricotta or cottage cheese. Low-fat or nonfat yogurt. Low-fat, low-sodium cheese. Fats and oils Soft margarine without trans fats. Vegetable oil. Low-fat, reduced-fat, or light mayonnaise and salad dressings (reduced-sodium). Canola, safflower, olive, soybean, and sunflower oils. Avocado. Seasoning and other foods Herbs. Spices. Seasoning mixes without salt. Unsalted popcorn and pretzels. Fat-free sweets. What foods are not recommended? The items listed may not be a complete list. Talk with your dietitian about what dietary choices are best for you. Grains Baked goods made with fat, such as croissants, muffins, or some breads. Dry pasta or rice meal packs. Vegetables Creamed or fried vegetables. Vegetables in a cheese sauce. Regular canned vegetables (not low-sodium or reduced-sodium). Regular canned tomato sauce and paste (not low-sodium or reduced-sodium). Regular tomato and vegetable juice (not low-sodium or reduced-sodium). Angie Fava. Olives. Fruits Canned fruit in a light or heavy syrup. Fried fruit. Fruit in cream or butter sauce. Meat and other protein foods Fatty cuts of meat. Ribs. Fried meat. Berniece Salines. Sausage. Bologna and other processed lunch meats. Salami. Fatback. Hotdogs. Bratwurst. Salted nuts and seeds. Canned beans with added salt. Canned or smoked fish. Whole eggs or egg yolks. Chicken or [place]  with skin. Dairy Whole or 2% milk, cream, and half-and-half. Whole or full-fat cream cheese. Whole-fat or sweetened yogurt. Full-fat cheese. Nondairy creamers. Whipped toppings. Processed cheese and cheese spreads. Fats and oils Butter. Stick margarine. Lard. Shortening. Ghee. Bacon fat. Tropical oils, such as coconut, palm kernel, or palm oil. Seasoning and other foods Salted popcorn and pretzels. Onion salt, garlic salt, seasoned salt, table salt, and sea salt. Worcestershire sauce. Tartar sauce. Barbecue sauce. Teriyaki sauce. Soy sauce, including reduced-sodium. Steak sauce. Canned and packaged gravies. Fish sauce. Oyster sauce. Cocktail sauce. Horseradish that you find on the shelf. Ketchup. Mustard. Meat flavorings and tenderizers. Bouillon cubes. Hot sauce and Tabasco sauce. Premade or packaged marinades. Premade or packaged taco seasonings. Relishes. Regular salad dressings. Where to find more information:  National Heart, Lung, and [place]: https://wilson-eaton.com/  American Heart Association: www.heart.org Summary  The DASH eating plan is a healthy eating plan that has been shown to reduce high blood pressure (hypertension). It may also reduce your risk for type 2 diabetes, heart disease, and stroke.  With the DASH eating plan, you should limit salt (sodium) intake to 2,300 mg a day. If you have hypertension, you may need to reduce your sodium intake to 1,500 mg a day.  When on the DASH eating plan, aim to eat more fresh fruits and  vegetables, whole grains, lean proteins, low-fat dairy, and heart-healthy fats.  Work with your health care provider or diet and nutrition specialist (dietitian) to adjust your eating plan to your individual calorie needs. This information is not intended to replace advice given to you by your health care provider. Make sure you discuss any questions you have with your health care provider. Document Released: 10/25/2011 Document Revised: 10/18/2017  Document Reviewed: 10/29/2016 Elsevier Patient Education  2020 [employer].

## 2019-11-02 NOTE — Progress Notes (Signed)
Subjective:  Patient ID: Shawn James, male    DOB: Feb 26, 1981  Age: 38 y.o. MRN: [id number]  CC: Establish Care (TOC from [facility]--BP follow up and refill in the future--pt check his BP at work at times (do not remember the reading). )  HPI  HTN: Improved with clonidine BID, amlodipine and lisinopril/HCTZ. Denies any adverse effects with medications. BP Readings from Last 3 Encounters:  11/02/19 110/88  10/09/19 (!) 150/110  07/28/18 (!) 126/98   Obesity: Regular diet. Has not been about to exercise due to [place] [place] pandemic. No hx of DM, no lipid panel checked recently, no hx of sleep apnea. BP Readings from Last 3 Encounters:  11/02/19 110/88  10/09/19 (!) 150/110  07/28/18 (!) 126/98   Reviewed past Medical, Social and Family history today.  Outpatient Medications Prior to Visit  Medication Sig Dispense Refill  . fluticasone (FLONASE) 50 MCG/ACT nasal spray Place 2 sprays into both nostrils daily. 16 g 6  . tadalafil (CIALIS) 5 MG tablet TAKE 1 TABLET BY MOUTH ONCE DAILY AS NEEDED FOR ERECTILE DYSFUNCTION 10 tablet 1  . cloNIDine (CATAPRES) 0.1 MG tablet Take 1 tablet (0.1 mg total) by mouth 2 (two) times daily. 180 tablet 0  . lisinopril-hydrochlorothiazide (ZESTORETIC) 20-12.5 MG tablet TAKE 2 TABLETS BY MOUTH ONCE DAILY 180 tablet 0  . triamcinolone cream (KENALOG) 0.1 % Apply 1 application topically 2 (two) times daily. 30 g 0  . amLODipine (NORVASC) 10 MG tablet Take 1 tablet (10 mg total) by mouth daily. (Patient not taking: Reported on 11/02/2019) 90 tablet 0  . ibuprofen (ADVIL,MOTRIN) 600 MG tablet Take 1 tablet (600 mg total) by mouth every 6 (six) hours as needed. (Patient not taking: Reported on 11/02/2019) 30 tablet 0  . tiZANidine (ZANAFLEX) 4 MG tablet Take 1 tablet (4 mg total) by mouth every 6 (six) hours as needed for muscle spasms. (Patient not taking: Reported on 11/02/2019) 30 tablet 1   No facility-administered medications prior to visit.    ROS See  HPI  Objective:  BP 110/88   Pulse 76   Temp 97.7 F (36.5 C) (Tympanic)   Ht 5\' 11"  (1.803 m)   Wt 285 lb 9.6 oz (129.5 kg)   SpO2 97%   BMI 39.83 kg/m   BP Readings from Last 3 Encounters:  11/02/19 110/88  10/09/19 (!) 150/110  07/28/18 (!) 126/98    Wt Readings from Last 3 Encounters:  11/02/19 285 lb 9.6 oz (129.5 kg)  10/09/19 277 lb 1.3 oz (125.7 kg)  07/28/18 278 lb (126.1 kg)    Physical Exam Constitutional:      Appearance: He is obese.  Cardiovascular:     Rate and Rhythm: Normal rate and regular rhythm.     Pulses: Normal pulses.     Heart sounds: Normal heart sounds.  Pulmonary:     Effort: Pulmonary effort is normal.     Breath sounds: Normal breath sounds.  Musculoskeletal:     Right lower leg: Edema present.     Left lower leg: Edema present.  Neurological:     Mental Status: He is alert and oriented to person, place, and time.  Psychiatric:        Mood and Affect: Mood normal.        Behavior: Behavior normal.        Thought Content: Thought content normal.     Lab Results  Component Value Date   WBC 7.4 10/09/2019   HGB 15.8 10/09/2019  HCT 47.9 10/09/2019   PLT 235.0 10/09/2019   GLUCOSE 97 10/09/2019   ALT 47 10/09/2019   AST 36 10/09/2019   NA 137 10/09/2019   K 3.7 10/09/2019   CL 99 10/09/2019   CREATININE 1.17 10/09/2019   BUN 16 10/09/2019   CO2 30 10/09/2019   PSA 1.21 03/14/2018    Assessment & Plan:  This visit occurred during the SARS-CoV-2 public health emergency.  Safety protocols were in place, including screening questions prior to the visit, additional usage of staff PPE, and extensive cleaning of exam room while observing appropriate contact time as indicated for disinfecting solutions.   Shawn James was seen today for establish care.  Diagnoses and all orders for this visit:  Essential hypertension -     cloNIDine (CATAPRES) 0.1 MG tablet; Take 1 tablet (0.1 mg total) by mouth 2 (two) times daily. -      amLODipine (NORVASC) 10 MG tablet; Take 1 tablet (10 mg total) by mouth at bedtime. -     lisinopril-hydrochlorothiazide (ZESTORETIC) 20-12.5 MG tablet; TAKE 2 TABLETS BY MOUTH ONCE DAILY  Class 2 severe obesity due to excess calories with serious comorbidity and body mass index (BMI) of 39.0 to 39.9 in adult [facility])   I have discontinued Shawn James's ibuprofen, tiZANidine, and triamcinolone cream. I have also changed his amLODipine. Additionally, I am having him maintain his fluticasone, tadalafil, cloNIDine, and lisinopril-hydrochlorothiazide.  Meds ordered this encounter  Medications  . cloNIDine (CATAPRES) 0.1 MG tablet    Sig: Take 1 tablet (0.1 mg total) by mouth 2 (two) times daily.    Dispense:  180 tablet    Refill:  3    Order Specific Question:   Supervising Provider    Answer:   MATTHEWS, CODY [[id number]]  . amLODipine (NORVASC) 10 MG tablet    Sig: Take 1 tablet (10 mg total) by mouth at bedtime.    Dispense:  90 tablet    Refill:  3    Order Specific Question:   Supervising Provider    Answer:   MATTHEWS, CODY [[id number]]  . lisinopril-hydrochlorothiazide (ZESTORETIC) 20-12.5 MG tablet    Sig: TAKE 2 TABLETS BY MOUTH ONCE DAILY    Dispense:  180 tablet    Refill:  1    Order Specific Question:   Supervising Provider    Answer:   MATTHEWS, CODY [[id number]]   Problem List Items Addressed This Visit      Cardiovascular and Mediastinum   Essential hypertension - Primary   Relevant Medications   cloNIDine (CATAPRES) 0.1 MG tablet   amLODipine (NORVASC) 10 MG tablet   lisinopril-hydrochlorothiazide (ZESTORETIC) 20-12.5 MG tablet    Other Visit Diagnoses    Class 2 severe obesity due to excess calories with serious comorbidity and body mass index (BMI) of 39.0 to 39.9 in adult [facility])          Follow-up: Return in about 6 months (around 05/02/2020) for CPE and HTN f/up (F2F).  Alysia Penna, NP

## 2019-12-28 ENCOUNTER — Encounter: Payer: Self-pay | Admitting: Nurse Practitioner

## 2019-12-28 DIAGNOSIS — N529 Male erectile dysfunction, unspecified: Secondary | ICD-10-CM

## 2019-12-29 MED ORDER — TADALAFIL 5 MG PO TABS: 5.0000 mg | ORAL_TABLET | Freq: Every day | ORAL | 0 refills | Status: DC | PRN

## 2020-02-23 ENCOUNTER — Encounter (HOSPITAL_COMMUNITY): Payer: Self-pay | Admitting: Emergency Medicine

## 2020-02-23 ENCOUNTER — Emergency Department (HOSPITAL_COMMUNITY)
Admission: EM | Admit: 2020-02-23 | Discharge: 2020-02-24 | Disposition: A | Discharge status: Home / Self Care | Payer: BC Managed Care – PPO | Attending: Emergency Medicine | Admitting: Emergency Medicine

## 2020-02-23 ENCOUNTER — Other Ambulatory Visit: Payer: Self-pay

## 2020-02-23 DIAGNOSIS — R55 Syncope and collapse: Secondary | ICD-10-CM | POA: Diagnosis not present

## 2020-02-23 DIAGNOSIS — Z6839 Body mass index (BMI) 39.0-39.9, adult: Secondary | ICD-10-CM | POA: Diagnosis not present

## 2020-02-23 DIAGNOSIS — R0902 Hypoxemia: Secondary | ICD-10-CM | POA: Diagnosis not present

## 2020-02-23 DIAGNOSIS — R110 Nausea: Secondary | ICD-10-CM | POA: Insufficient documentation

## 2020-02-23 DIAGNOSIS — R42 Dizziness and giddiness: Secondary | ICD-10-CM | POA: Diagnosis not present

## 2020-02-23 DIAGNOSIS — I10 Essential (primary) hypertension: Secondary | ICD-10-CM | POA: Diagnosis not present

## 2020-02-23 DIAGNOSIS — R000 Tachycardia, unspecified: Secondary | ICD-10-CM | POA: Diagnosis not present

## 2020-02-23 DIAGNOSIS — E669 Obesity, unspecified: Secondary | ICD-10-CM | POA: Insufficient documentation

## 2020-02-23 DIAGNOSIS — R29810 Facial weakness: Secondary | ICD-10-CM | POA: Diagnosis not present

## 2020-02-23 DIAGNOSIS — R002 Palpitations: Secondary | ICD-10-CM | POA: Insufficient documentation

## 2020-02-23 LAB — CBC WITH DIFFERENTIAL/PLATELET
Abs Immature Granulocytes: 0.05 10*3/uL (ref 0.00–0.07)
Basophils Absolute: 0 10*3/uL (ref 0.0–0.1)
Basophils Relative: 0 %
Eosinophils Absolute: 0 10*3/uL (ref 0.0–0.5)
Eosinophils Relative: 0 %
HCT: 50 % (ref 39.0–52.0)
Hemoglobin: 15.9 g/dL (ref 13.0–17.0)
Immature Granulocytes: 0 %
Lymphocytes Relative: 8 %
Lymphs Abs: 1.1 10*3/uL (ref 0.7–4.0)
MCH: 26.5 pg (ref 26.0–34.0)
MCHC: 31.8 g/dL (ref 30.0–36.0)
MCV: 83.2 fL (ref 80.0–100.0)
Monocytes Absolute: 0.6 10*3/uL (ref 0.1–1.0)
Monocytes Relative: 4 %
Neutro Abs: 11.9 10*3/uL — ABNORMAL HIGH (ref 1.7–7.7)
Neutrophils Relative %: 88 %
Platelets: 273 10*3/uL (ref 150–400)
RBC: 6.01 MIL/uL — ABNORMAL HIGH (ref 4.22–5.81)
RDW: 12.5 % (ref 11.5–15.5)
WBC: 13.6 10*3/uL — ABNORMAL HIGH (ref 4.0–10.5)
nRBC: 0 % (ref 0.0–0.2)

## 2020-02-23 LAB — COMPREHENSIVE METABOLIC PANEL
ALT: 52 U/L — ABNORMAL HIGH (ref 0–44)
AST: 40 U/L (ref 15–41)
Albumin: 4.2 g/dL (ref 3.5–5.0)
Alkaline Phosphatase: 45 U/L (ref 38–126)
Anion gap: 12 (ref 5–15)
BUN: 16 mg/dL (ref 6–20)
CO2: 24 mmol/L (ref 22–32)
Calcium: 9.5 mg/dL (ref 8.9–10.3)
Chloride: 103 mmol/L (ref 98–111)
Creatinine, Ser: 1.37 mg/dL — ABNORMAL HIGH (ref 0.61–1.24)
GFR calc Af Amer: >60 mL/min (ref >60)
GFR calc non Af Amer: >60 mL/min (ref >60)
Glucose, Bld: 150 mg/dL — ABNORMAL HIGH (ref 70–99)
Potassium: 3.8 mmol/L (ref 3.5–5.1)
Sodium: 139 mmol/L (ref 135–145)
Total Bilirubin: 0.5 mg/dL (ref 0.3–1.2)
Total Protein: 7.3 g/dL (ref 6.5–8.1)

## 2020-02-23 NOTE — ED Triage Notes (Signed)
Pt brought to ED by [facility] from home for c/o increase fatigue with nausea for a week, no fever, no change on taste or smell. BP 146/99, HR 108, R-20 96% RA.

## 2020-02-24 ENCOUNTER — Emergency Department (HOSPITAL_COMMUNITY): Payer: BC Managed Care – PPO

## 2020-02-24 DIAGNOSIS — R002 Palpitations: Secondary | ICD-10-CM | POA: Diagnosis not present

## 2020-02-24 DIAGNOSIS — R55 Syncope and collapse: Secondary | ICD-10-CM | POA: Diagnosis not present

## 2020-02-24 LAB — MAGNESIUM: Magnesium: 2 mg/dL (ref 1.7–2.4)

## 2020-02-24 LAB — TROPONIN I (HIGH SENSITIVITY): Troponin I (High Sensitivity): 7 ng/L (ref <18)

## 2020-02-24 MED ORDER — SODIUM CHLORIDE 0.9 % IV BOLUS: 1000.0000 mL | Freq: Once | INTRAVENOUS | Status: DC

## 2020-02-24 NOTE — ED Provider Notes (Signed)
[facility] EMERGENCY DEPARTMENT Provider Note   CSN: [id number] Arrival date & time: 02/23/20  2213     History Chief Complaint  Patient presents with   Fatigue    Shawn James is a 39 y.o. male with PMH significant for HTN, obesity, and migraines who presents to the ED via EMS after feeling acutely lightheaded, "seasick", nauseated, palpitations, and feeling as though he may pass out.  He was working third shift at [employer] when he experienced these onset of symptoms.  He reports that he went to the floor because he felt like he may pass out.  His supervisor subsequently told him to at home and get some rest.  When he got to his car, he still felt uncomfortable with prompted him to call his mom who then called 911.  On my exam, patient had been already in the ER for approximately 8 hours.  He is tired, but no longer is feeling the palpitations, flushing, or "seasick" symptoms that he experienced acutely while at the grocery store.  He denies any recent illness, fevers or chills, headache, sore throat, chest pain, cough, shortness of breath, abdominal pain, nausea or vomiting, or other symptoms.  No family Hx of premature cardiac death.   HPI     Past Medical History:  Diagnosis Date   Bursitis    Chicken pox    Hypertension    Migraine    Obesity     Patient Active Problem List   Diagnosis Date Noted   Class 2 severe obesity due to excess calories with serious comorbidity and body mass index (BMI) of 39.0 to 39.9 in adult [facility]) 11/02/2019   Erectile dysfunction 03/14/2018   Chlamydia contact, treated 12/19/2015   Bilateral shoulder bursitis 12/24/2014   Essential hypertension 12/09/2014    History reviewed. No pertinent surgical history.     Family History  Problem Relation Age of Onset   Heart disease Mother    Diabetes Mother    Heart disease Father    Diabetes Father    Cancer Father        prostate cancer   Bell's  palsy Brother     Social History   Tobacco Use   Smoking status: Former Smoker    Years: 2.00    Types: Cigars    Quit date: 11/20/2015    Years since quitting: 4.2   Smokeless tobacco: Never Used  Substance Use Topics   Alcohol use: Yes    Alcohol/week: 1.0 standard drinks    Types: 1 Cans of beer per week    Comment: twice a week   Drug use: Never    Home Medications Prior to Admission medications   Medication Sig Start Date End Date Taking? Authorizing Provider  acetaminophen (TYLENOL) 500 MG tablet Take 500 mg by mouth every 6 (six) hours as needed for mild pain.   Yes [provider]  amLODipine (NORVASC) 10 MG tablet Take 1 tablet (10 mg total) by mouth at bedtime. 11/02/19  Yes Nche, Bonna Gains, NP  calcium carbonate (TUMS EX) 750 MG chewable tablet Chew 2 tablets by mouth daily as needed for heartburn.   Yes [provider]  cloNIDine (CATAPRES) 0.1 MG tablet Take 1 tablet (0.1 mg total) by mouth 2 (two) times daily. 11/02/19  Yes Nche, Bonna Gains, NP  lisinopril-hydrochlorothiazide (ZESTORETIC) 20-12.5 MG tablet TAKE 2 TABLETS BY MOUTH ONCE DAILY 11/02/19  Yes Nche, Bonna Gains, NP  Melatonin 10 MG TABS Take 10  mg by mouth at bedtime.   Yes [provider]  tadalafil (CIALIS) 5 MG tablet Take 1 tablet (5 mg total) by mouth daily as needed for erectile dysfunction. 12/29/19  Yes Nche, Bonna Gains, NP  fluticasone (FLONASE) 50 MCG/ACT nasal spray Place 2 sprays into both nostrils daily. Patient not taking: Reported on 02/24/2020 03/28/18   Evaristo Bury, NP    Allergies    Patient has no known allergies.  Review of Systems   Review of Systems  Constitutional: Negative for fever.  Respiratory: Negative for cough and shortness of breath.   Cardiovascular: Positive for palpitations. Negative for chest pain.  Gastrointestinal: Positive for nausea. Negative for abdominal pain and vomiting.  Neurological: Positive for  light-headedness. Negative for dizziness.     Physical Exam Updated Vital Signs BP (!) 129/91    Pulse 87    Temp 98.3 F (36.8 C) (Oral)    Resp 14    Ht 5\' 11"  (1.803 m)    Wt 129.5 kg    SpO2 99%    BMI 39.82 kg/m   Physical Exam Vitals and nursing note reviewed. Exam conducted with a chaperone present.  Constitutional:      General: He is not in acute distress.    Appearance: Normal appearance.  HENT:     Head: Normocephalic and atraumatic.  Eyes:     General: No scleral icterus.    Conjunctiva/sclera: Conjunctivae normal.  Cardiovascular:     Rate and Rhythm: Normal rate and regular rhythm.     Pulses: Normal pulses.     Heart sounds: Normal heart sounds.  Pulmonary:     Effort: Pulmonary effort is normal. No respiratory distress.     Breath sounds: Normal breath sounds.  Abdominal:     General: Abdomen is flat. There is no distension.     Palpations: Abdomen is soft.     Tenderness: There is no abdominal tenderness.  Musculoskeletal:     Cervical back: Normal range of motion and neck supple. No rigidity.  Skin:    General: Skin is dry.     Capillary Refill: Capillary refill takes less than 2 seconds.  Neurological:     Mental Status: He is alert.     GCS: GCS eye subscore is 4. GCS verbal subscore is 5. GCS motor subscore is 6.  Psychiatric:        Mood and Affect: Mood normal.        Behavior: Behavior normal.        Thought Content: Thought content normal.     ED Results / Procedures / Treatments   Labs (all labs ordered are listed, but only abnormal results are displayed) Labs Reviewed  CBC WITH DIFFERENTIAL/PLATELET - Abnormal; Notable for the following components:      Result Value   WBC 13.6 (*)    RBC 6.01 (*)    Neutro Abs 11.9 (*)    All other components within normal limits  COMPREHENSIVE METABOLIC PANEL - Abnormal; Notable for the following components:   Glucose, Bld 150 (*)    Creatinine, Ser 1.37 (*)    ALT 52 (*)    All other components  within normal limits  MAGNESIUM  TROPONIN I (HIGH SENSITIVITY)    EKG EKG Interpretation  Date/Time:  Wednesday February 24 2020 07:23:26 EDT Ventricular Rate:  81 PR Interval:    QRS Duration: 99 QT Interval:  381 QTC Calculation: 443 R Axis:   56 Text Interpretation: Sinus rhythm Abnormal  T, consider ischemia, diffuse leads Minimal ST elevation, lateral leads When comaprd to prior, similar T wave inversions. No STEMI Confirmed by Antony Blackbird [phone]) on 02/24/2020 7:38:45 AM   Radiology DG Chest Portable 1 View  Result Date: 02/24/2020 CLINICAL DATA:  Near syncope, palpitations EXAM: PORTABLE CHEST 1 VIEW COMPARISON:  None. FINDINGS: The heart size and mediastinal contours are within normal limits. Both lungs are clear. The visualized skeletal structures are unremarkable. IMPRESSION: No acute abnormality of the lungs in AP portable projection. Electronically Signed   By: Eddie Candle M.D.   On: 02/24/2020 08:12    Procedures Procedures (including critical care time)  Medications Ordered in ED Medications  sodium chloride 0.9 % bolus 1,000 mL (0 mLs Intravenous Stopping Infusion hung by another clincian 02/24/20 2297)    ED Course  I have reviewed the triage vital signs and the nursing notes.  Pertinent labs & imaging results that were available during my care of the patient were reviewed by me and considered in my medical decision making (see chart for details).    MDM Rules/Calculators/A&P                      Patient presents to the ED via EMS after having a near syncopal episode while at work.  His creatinine is mildly bumped to 1.37 from 1.17 labs obtained 4 months ago.  He was also tachycardic to 109 on arrival.  CBC notable for a mild leukocytosis of 13.6 with left shift.  Patient denies feeling ill, fevers or chills, abdominal pain, urinary or bowel symptoms, or other symptoms concerning for infection.  EKG demonstrates possible ischemia and T wave inversions, however no  significant change when compared to prior tracing.  Will obtain troponin given his palpitations and abnormal EKG.  P was atient is PERC negative and do not feel as though CTA is warranted.   Magnesium 2.0 and troponin was within normal onset 7.  Given the fact that his symptoms began 12 hours prior to obtaining troponin, do not need to trend.  Chest x-ray is personally reviewed and does not demonstrate any acute cardiopulmonary findings.  On subsequent evaluation after patient received IVF, patient is feeling entirely improved.  He denies any history of cardiac disease, but given his reported palpitations I would like for him to at least discuss a Holter monitor with his primary care provider.  He reports that he will be able to get in to see her, likely within the next few days.  He may also want to discuss whether his Cialis or antihypertensive medications may have contributed to his presyncopal episode.  Strict ED return precautions discussed with the patient.  All of the evaluation and work-up results were discussed with the patient and any family at bedside. They were provided opportunity to ask any additional questions and have none at this time. They have expressed understanding of verbal discharge instructions as well as return precautions and are agreeable to the plan.    Final Clinical Impression(s) / ED Diagnoses Final diagnoses:  Near syncope    Rx / DC Orders ED Discharge Orders    None       Corena Herter, PA-C 02/24/20 1040    Tegeler, Gwenyth Allegra, MD 02/24/20 [phone]

## 2020-02-24 NOTE — Discharge Instructions (Addendum)
Please read the attachment on near-syncope.  You will need to follow-up with your primary care provider regarding today's encounter.  You may benefit from Holter monitor placement to evaluate for arrhythmia.  You have an abnormal EKG.  While you deny any cardiac history, he may benefit from cardiac evaluation.  You are also on multiple medications that can increase your risk for syncope that you may wish to discuss with your primary care provider.  Return to the ED or seek immediate medical attention should you experience any new or worsening symptoms.

## 2020-02-24 NOTE — ED Notes (Signed)
Patient given discharge instructions patient verbalizes understanding. 

## 2020-02-24 NOTE — ED Notes (Signed)
Bag of NS completed.

## 2020-05-02 ENCOUNTER — Encounter: Payer: BC Managed Care – PPO | Admitting: Nurse Practitioner

## 2020-05-04 ENCOUNTER — Other Ambulatory Visit: Payer: Self-pay | Admitting: Nurse Practitioner

## 2020-05-04 DIAGNOSIS — N529 Male erectile dysfunction, unspecified: Secondary | ICD-10-CM

## 2020-05-05 MED ORDER — TADALAFIL 5 MG PO TABS: 5.0000 mg | ORAL_TABLET | Freq: Every day | ORAL | 0 refills | Status: DC | PRN

## 2020-05-05 NOTE — Telephone Encounter (Signed)
Last OV 11/02/19 Last refill 12/29/19  #10/0

## 2020-05-16 ENCOUNTER — Encounter: Payer: BC Managed Care – PPO | Admitting: Nurse Practitioner

## 2020-05-19 ENCOUNTER — Telehealth: Payer: Self-pay | Admitting: Nurse Practitioner

## 2020-05-19 ENCOUNTER — Encounter: Payer: Self-pay | Admitting: Nurse Practitioner

## 2020-05-19 NOTE — Telephone Encounter (Signed)
Pt was no show for appt 05/16/2020 for cpe. 1st no show occurrence and fee waived. Letter has been mailed.   PCP,  Please reply back with corresponding letter matching appropriate follow up needs.  A - No follow up necessary B - Follow up urgent - locate patient immediately to schedule appointment. C - Follow up necessary. Contact patient and schedule visit w/in 7 days. D - Follow up necessary. Contact patient and schedule visit w/in 2-4 weeks.  E - Follow up necessary. Contact patient and schedule visit w/in 3 months.

## 2020-05-19 NOTE — Telephone Encounter (Signed)
D

## 2020-05-20 NOTE — Telephone Encounter (Signed)
Please contact and schedule as advised by PCP

## 2020-05-20 NOTE — Telephone Encounter (Signed)
Called patient and left message for him to call our office and reschedule missed appt.

## 2020-06-04 ENCOUNTER — Encounter (HOSPITAL_BASED_OUTPATIENT_CLINIC_OR_DEPARTMENT_OTHER): Payer: Self-pay

## 2020-06-04 ENCOUNTER — Other Ambulatory Visit: Payer: Self-pay

## 2020-06-04 DIAGNOSIS — Z87891 Personal history of nicotine dependence: Secondary | ICD-10-CM | POA: Diagnosis not present

## 2020-06-04 DIAGNOSIS — Z8042 Family history of malignant neoplasm of prostate: Secondary | ICD-10-CM | POA: Insufficient documentation

## 2020-06-04 DIAGNOSIS — I10 Essential (primary) hypertension: Secondary | ICD-10-CM | POA: Insufficient documentation

## 2020-06-04 DIAGNOSIS — Z79899 Other long term (current) drug therapy: Secondary | ICD-10-CM | POA: Diagnosis not present

## 2020-06-04 DIAGNOSIS — N342 Other urethritis: Secondary | ICD-10-CM | POA: Diagnosis not present

## 2020-06-04 DIAGNOSIS — Z202 Contact with and (suspected) exposure to infections with a predominantly sexual mode of transmission: Secondary | ICD-10-CM | POA: Diagnosis not present

## 2020-06-04 NOTE — ED Triage Notes (Signed)
Pt states he was notified by a partner that he was exposed to an STD. Pt reports that he "may have had white discharge" this AM.

## 2020-06-05 ENCOUNTER — Emergency Department (HOSPITAL_BASED_OUTPATIENT_CLINIC_OR_DEPARTMENT_OTHER)
Admission: EM | Admit: 2020-06-05 | Discharge: 2020-06-05 | Disposition: A | Discharge status: Home / Self Care | Payer: BC Managed Care – PPO | Attending: Emergency Medicine | Admitting: Emergency Medicine

## 2020-06-05 DIAGNOSIS — N342 Other urethritis: Secondary | ICD-10-CM

## 2020-06-05 MED ORDER — AZITHROMYCIN 1 G PO PACK
1.0000 g | PACK | Freq: Once | ORAL | Status: AC
  Administered 2020-06-05: 1 g via ORAL
  Filled 2020-06-05: qty 1

## 2020-06-05 MED ORDER — LIDOCAINE HCL (PF) 1 % IJ SOLN
INTRAMUSCULAR | Status: AC
  Filled 2020-06-05: qty 5

## 2020-06-05 MED ORDER — CEFTRIAXONE SODIUM 500 MG IJ SOLR
500.0000 mg | Freq: Once | INTRAMUSCULAR | Status: AC
  Administered 2020-06-05: 500 mg via INTRAMUSCULAR
  Filled 2020-06-05: qty 500

## 2020-06-05 NOTE — ED Provider Notes (Signed)
[facility] Provider Note: Lowella Dell, MD, FACEP  CSN: [id number] MRN: [id number] ARRIVAL: 06/04/20 at 2240 ROOM: MH09/MH09   CHIEF COMPLAINT  Exposure to STD   HISTORY OF PRESENT ILLNESS  06/05/20 1:22 AM Shawn James is a 39 y.o. male who was told recently that he had been exposed to an STD.  He does not know what STD he might have.  He has had a white urethral discharge which she noticed yesterday.  He has no dysuria or discomfort.  He has not had a fever.  He does have a mild sore throat.  He denies engagement in oral sex.   Past Medical History:  Diagnosis Date  . Bursitis   . Chicken pox   . Hypertension   . Migraine   . Obesity     History reviewed. No pertinent surgical history.  Family History  Problem Relation Age of Onset  . Heart disease Mother   . Diabetes Mother   . Heart disease Father   . Diabetes Father   . Cancer Father        prostate cancer  . Bell's palsy Brother     Social History   Tobacco Use  . Smoking status: Former Smoker    Years: 2.00    Types: Cigars    Quit date: 11/20/2015    Years since quitting: 4.5  . Smokeless tobacco: Never Used  Vaping Use  . Vaping Use: Never used  Substance Use Topics  . Alcohol use: Yes    Alcohol/week: 1.0 standard drink    Types: 1 Cans of beer per week    Comment: twice a week  . Drug use: Never    Prior to Admission medications   Medication Sig Start Date End Date Taking? Authorizing Provider  acetaminophen (TYLENOL) 500 MG tablet Take 500 mg by mouth every 6 (six) hours as needed for mild pain.    [provider]  amLODipine (NORVASC) 10 MG tablet Take 1 tablet (10 mg total) by mouth at bedtime. 11/02/19   Nche, Bonna Gains, NP  calcium carbonate (TUMS EX) 750 MG chewable tablet Chew 2 tablets by mouth daily as needed for heartburn.    [provider]  cloNIDine (CATAPRES) 0.1 MG tablet Take 1 tablet (0.1 mg total) by mouth 2 (two) times daily. 11/02/19    Nche, Bonna Gains, NP  fluticasone (FLONASE) 50 MCG/ACT nasal spray Place 2 sprays into both nostrils daily. Patient not taking: Reported on 02/24/2020 03/28/18   Evaristo Bury, NP  lisinopril-hydrochlorothiazide (ZESTORETIC) 20-12.5 MG tablet TAKE 2 TABLETS BY MOUTH ONCE DAILY 11/02/19   Nche, Bonna Gains, NP  Melatonin 10 MG TABS Take 10 mg by mouth at bedtime.    [provider]  tadalafil (CIALIS) 5 MG tablet Take 1 tablet (5 mg total) by mouth daily as needed for erectile dysfunction. 05/05/20   Nche, Bonna Gains, NP    Allergies Patient has no known allergies.   REVIEW OF SYSTEMS  Negative except as noted here or in the History of Present Illness.   PHYSICAL EXAMINATION  Initial Vital Signs Blood pressure (!) 149/81, pulse 73, temperature 98.9 F (37.2 C), temperature source Oral, resp. rate 18, height 5\' 11"  (1.803 m), weight 117.9 kg, SpO2 99 %.  Examination General: Well-developed, well-nourished male in no acute distress; appearance consistent with age of record HENT: normocephalic; atraumatic; pharyngeal erythema without exudate Eyes: pupils equal, round and reactive to light; extraocular muscles intact Neck: supple Heart: regular  rate and rhythm Lungs: clear to auscultation bilaterally Abdomen: soft; nondistended; nontender; bowel sounds present GU: Tanner V male; thick, greenish urethral discharge Extremities: No deformity; full range of motion Neurologic: Awake, alert and oriented; motor function intact in all extremities and symmetric; no facial droop Skin: Warm and dry Psychiatric: Normal mood and affect   RESULTS  Summary of this visit's results, reviewed and interpreted by myself:   EKG Interpretation  Date/Time:    Ventricular Rate:    PR Interval:    QRS Duration:   QT Interval:    QTC Calculation:   R Axis:     Text Interpretation:        Laboratory Studies: No results found for this or any previous visit (from the past 24  hour(s)). Imaging Studies: No results found.  ED COURSE and MDM  Nursing notes, initial and subsequent vitals signs, including pulse oximetry, reviewed and interpreted by myself.  Vitals:   06/04/20 2246 06/04/20 2247 06/05/20 0059  BP: (!) 165/112  (!) 149/81  Pulse: (!) 56  73  Resp: 20  18  Temp: 98.9 F (37.2 C)    TempSrc: Oral    SpO2: 97%  99%  Weight:  117.9 kg   Height:  5\' 11"  (1.803 m)    Medications  cefTRIAXone (ROCEPHIN) injection 500 mg (has no administration in time range)  azithromycin (ZITHROMAX) powder 1 g (has no administration in time range)    The patient's presentation is consistent with urethritis, likely gonorrhea.  We will treat for gonorrhea and chlamydia.  PROCEDURES  Procedures   ED DIAGNOSES     ICD-10-CM   1. Urethritis  N34.2        Shallen Luedke, MD 06/05/20 [phone]

## 2020-06-06 LAB — GC/CHLAMYDIA PROBE AMP (~~LOC~~) NOT AT ARMC
Chlamydia: POSITIVE — AB
Comment: NEGATIVE
Comment: NORMAL
Neisseria Gonorrhea: POSITIVE — AB

## 2020-06-28 ENCOUNTER — Encounter: Payer: Self-pay | Admitting: Nurse Practitioner

## 2020-06-29 ENCOUNTER — Telehealth (HOSPITAL_COMMUNITY): Payer: Self-pay

## 2020-06-29 NOTE — Addendum Note (Signed)
Addended by: Lake Bells on: 06/29/2020 02:53 PM   Modules accepted: Orders

## 2020-08-04 ENCOUNTER — Other Ambulatory Visit: Payer: Self-pay

## 2020-08-04 ENCOUNTER — Encounter: Payer: Self-pay | Admitting: Neurology

## 2020-08-04 ENCOUNTER — Ambulatory Visit (INDEPENDENT_AMBULATORY_CARE_PROVIDER_SITE_OTHER): Payer: BC Managed Care – PPO | Admitting: Neurology

## 2020-08-04 VITALS — BP 146/92 | HR 89 | Ht 71.0 in | Wt 280.0 lb

## 2020-08-04 DIAGNOSIS — R0683 Snoring: Secondary | ICD-10-CM

## 2020-08-04 DIAGNOSIS — R519 Headache, unspecified: Secondary | ICD-10-CM

## 2020-08-04 DIAGNOSIS — E669 Obesity, unspecified: Secondary | ICD-10-CM | POA: Diagnosis not present

## 2020-08-04 DIAGNOSIS — G4719 Other hypersomnia: Secondary | ICD-10-CM | POA: Diagnosis not present

## 2020-08-04 DIAGNOSIS — R351 Nocturia: Secondary | ICD-10-CM

## 2020-08-04 DIAGNOSIS — Z820 Family history of epilepsy and other diseases of the nervous system: Secondary | ICD-10-CM

## 2020-08-04 DIAGNOSIS — R0681 Apnea, not elsewhere classified: Secondary | ICD-10-CM

## 2020-08-04 DIAGNOSIS — Z9189 Other specified personal risk factors, not elsewhere classified: Secondary | ICD-10-CM

## 2020-08-04 NOTE — Progress Notes (Signed)
Subjective:    Patient ID: Shawn James is a 39 y.o. male.  HPI     Huston Foley, MD, PhD [facility] [address] [address] [place], [place] [address]  Dear Claris Gower,   I saw your patient, Shawn James, upon your kind request, in my Sleep clinic today for initial consultation of his sleep disorder, in particular, concern for underlying obstructive sleep apnea.  The patient is unaccompanied today.  As you know, someone is a 39 year old right-handed gentleman with an underlying medical history of hypertension, migraine headaches, bursitis, and obesity, who reports snoring and excessive daytime somnolence.  I reviewed your office note from 11/02/2019.  His Epworth sleepiness score is 10 out of 24, fatigue severity score is 25 out of 63.  He is single, lives alone but does report that his family and friends noticed his snoring and that his mother has mentioned pauses in his breathing while he is asleep.  He does not feel fully rested.  He does report working 2 jobs, he works full-time in Office manager and also has a part-time job at [employer].  He goes to bed around 11:30 PM and rise time is generally around 6:45 AM.  He has nocturia about twice per average night and has woken up occasionally with a headache, sometimes it feels like one of his migraine-like headaches and sometimes it is a more dull and achy headache.  He takes over-the-counter medication if needed for headaches.  He has been on blood pressure medication, currently a total of 4 blood pressure medications.  He takes his medications on a regular basis.  He is a non-smoker and drinks alcohol very occasionally, does drink caffeine in the form of ice tea, typically 4 glasses/day on average.  He is working on weight loss and has joined a gym.  His brother has sleep apnea and uses a CPAP machine.  The patient would be willing to consider CPAP therapy.  He has a TV in the bedroom and generally falls asleep with the TV on  but the TV turns off on a timer.  He recently obtained a puppy.  His Past Medical History Is Significant For: Past Medical History:  Diagnosis Date  . Bursitis   . Chicken pox   . Hypertension   . Migraine   . Obesity     His Past Surgical History Is Significant For: No past surgical history on file.  His Family History Is Significant For: Family History  Problem Relation Age of Onset  . Heart disease Mother   . Diabetes Mother   . Heart disease Father   . Diabetes Father   . Cancer Father        prostate cancer  . Bell's palsy Brother     His Social History Is Significant For: Social History   Socioeconomic History  . Marital status: Single    Spouse name: Not on file  . Number of children: 1  . Years of education: 30  . Highest education level: Not on file  Occupational History  . Occupation: Electrical engineer  . Occupation: Sport and exercise psychologist  Tobacco Use  . Smoking status: Former Smoker    Years: 2.00    Types: Cigars    Quit date: 11/20/2015    Years since quitting: 4.7  . Smokeless tobacco: Never Used  Vaping Use  . Vaping Use: Never used  Substance and Sexual Activity  . Alcohol use: Yes    Alcohol/week: 1.0 standard drink  Types: 1 Cans of beer per week    Comment: twice a week  . Drug use: Never  . Sexual activity: Not on file  Other Topics Concern  . Not on file  Social History Narrative   [place], [place] Currently lives in an apartment with by himself. No pets. Fun: Movies, eating out, travel   Denies religious beliefs that would effect health care.    Social Determinants of Health   Financial Resource Strain:   . Difficulty of Paying Living Expenses: Not on file  Food Insecurity:   . Worried About Programme researcher, broadcasting/film/videounning Out of Food in the Last Year: Not on file  . Ran Out of Food in the Last Year: Not on file  Transportation Needs:   . Lack of Transportation (Medical): Not on file  . Lack of Transportation (Non-Medical): Not on file   Physical Activity:   . Days of Exercise per Week: Not on file  . Minutes of Exercise per Session: Not on file  Stress:   . Feeling of Stress : Not on file  Social Connections:   . Frequency of Communication with Friends and Family: Not on file  . Frequency of Social Gatherings with Friends and Family: Not on file  . Attends Religious Services: Not on file  . Active Member of Clubs or Organizations: Not on file  . Attends BankerClub or Organization Meetings: Not on file  . Marital Status: Not on file    His Allergies Are:  No Known Allergies:   His Current Medications Are:  Outpatient Encounter Medications as of 08/04/2020  Medication Sig  . amLODipine (NORVASC) 10 MG tablet Take 1 tablet (10 mg total) by mouth at bedtime.  . cloNIDine (CATAPRES) 0.1 MG tablet Take 1 tablet (0.1 mg total) by mouth 2 (two) times daily.  Marland Kitchen. lisinopril-hydrochlorothiazide (ZESTORETIC) 20-12.5 MG tablet TAKE 2 TABLETS BY MOUTH ONCE DAILY  . tadalafil (CIALIS) 5 MG tablet Take 1 tablet (5 mg total) by mouth daily as needed for erectile dysfunction.  . [DISCONTINUED] acetaminophen (TYLENOL) 500 MG tablet Take 500 mg by mouth every 6 (six) hours as needed for mild pain.  . [DISCONTINUED] calcium carbonate (TUMS EX) 750 MG chewable tablet Chew 2 tablets by mouth daily as needed for heartburn.  . [DISCONTINUED] fluticasone (FLONASE) 50 MCG/ACT nasal spray Place 2 sprays into both nostrils daily. (Patient not taking: Reported on 02/24/2020)  . [DISCONTINUED] Melatonin 10 MG TABS Take 10 mg by mouth at bedtime.   No facility-administered encounter medications on file as of 08/04/2020.  :  Review of Systems:  Out of a complete 14 point review of systems, all are reviewed and negative with the exception of these symptoms as listed below: Review of Systems  Neurological:       Here for sleep consult. No prior sleep study. Pt report he does snore at night along with daytime sleepiness/fatige.  Epworth Sleepiness  Scale 0= would never doze 1= slight chance of dozing 2= moderate chance of dozing 3= high chance of dozing  Sitting and reading:2 Watching TV:2 Sitting inactive in a public place (ex. Theater or meeting):2 As a passenger in a car for an hour without a break:1 Lying down to rest in the afternoon:2 Sitting and talking to someone:0 Sitting quietly after lunch (no alcohol):1 In a car, while stopped in traffic:0 Total:10     Objective:  Neurological Exam  Physical Exam Physical Examination:   Vitals:   08/04/20 1107  BP: Marland Kitchen(!)  146/92  Pulse: 89  SpO2: 94%    General Examination: The patient is a very pleasant 39 y.o. male in no acute distress. He appears well-developed and well-nourished and well groomed.   HEENT: Normocephalic, atraumatic, pupils are equal, round and reactive to light, extraocular tracking is good without limitation to gaze excursion or nystagmus noted. Hearing is grossly intact. Face is symmetric with normal facial animation. Speech is clear with no dysarthria noted. There is no hypophonia. There is no lip, neck/head, jaw or voice tremor. Neck is supple with full range of passive and active motion. There are no carotid bruits on auscultation. Oropharynx exam reveals: mild mouth dryness, adequate dental hygiene and marked airway crowding, due to small airway entry, larger uvula, tonsillar size of about 1-2+ and larger tongue.  Tongue protrudes centrally in palate elevates symmetrically.  Mallampati is class II.  He has a minimal overbite.  Neck circumference of 19-5/8 inches.  Chest: Clear to auscultation without wheezing, rhonchi or crackles noted.  Heart: S1+S2+0, regular and normal without murmurs, rubs or gallops noted.   Abdomen: Soft, non-tender and non-distended with normal bowel sounds appreciated on auscultation.  Extremities: There is no pitting edema in the distal lower extremities bilaterally.   Skin: Warm and dry without trophic changes noted.    Musculoskeletal: exam reveals no obvious joint deformities, tenderness or joint swelling or erythema.   Neurologically:  Mental status: The patient is awake, alert and oriented in all 4 spheres. His immediate and remote memory, attention, language skills and fund of knowledge are appropriate. There is no evidence of aphasia, agnosia, apraxia or anomia. Speech is clear with normal prosody and enunciation. Thought process is linear. Mood is normal and affect is normal.  Cranial nerves II - XII are as described above under HEENT exam.  Motor exam: Normal bulk, strength and tone is noted. There is no tremor, Romberg is negative. Fine motor skills and coordination: grossly intact.  Cerebellar testing: No dysmetria or intention tremor. There is no truncal or gait ataxia.  Sensory exam: intact to light touch in the upper and lower extremities.  Gait, station and balance: He stands easily. No veering to one side is noted. No leaning to one side is noted. Posture is age-appropriate and stance is narrow based. Gait shows normal stride length and normal pace. No problems turning are noted. Tandem walk is unremarkable.                Assessment and Plan:  In summary, Shawn James is a very pleasant 39 y.o.-year old male with an underlying medical history of hypertension, migraine headaches, bursitis, and obesity, whose history and physical exam are concerning for obstructive sleep apnea (OSA). I had a [place] chat with the patient about my findings and the diagnosis of OSA, its prognosis and treatment options. We talked about medical treatments, surgical interventions and non-pharmacological approaches. I explained in particular the risks and ramifications of untreated moderate to severe OSA, especially with respect to developing cardiovascular disease down the Road, including congestive heart failure, difficult to treat hypertension, cardiac arrhythmias, or stroke. Even type 2 diabetes has, in part, been linked to  untreated OSA. Symptoms of untreated OSA include daytime sleepiness, memory problems, mood irritability and mood disorder such as depression and anxiety, lack of energy, as well as recurrent headaches, especially morning headaches. We talked about trying to maintain a healthy lifestyle in general, as well as the importance of weight control. We also talked about the importance of good  sleep hygiene. I recommended the following at this time: sleep study.  I explained the sleep test procedure to the patient and also outlined possible surgical and non-surgical treatment options of OSA, including the use of a custom-made dental device (which would require a referral to a specialist dentist or oral surgeon), upper airway surgical options, such as traditional UPPP or a novel less invasive surgical option in the form of Inspire hypoglossal nerve stimulation (which would involve a referral to an ENT surgeon). I also explained the CPAP treatment option to the patient, who indicated that he would be willing to try CPAP if the need arises. I explained the importance of being compliant with PAP treatment, not only for insurance purposes but primarily to improve His symptoms, and for the patient's Gerlich term health benefit, including to reduce His cardiovascular risks. I answered all his questions today and the patient was in agreement. I plan to see him back after the sleep study is completed and encouraged him to call with any interim questions, concerns, problems or updates.   Thank you very much for allowing me to participate in the care of this nice patient. If I can be of any further assistance to you please do not hesitate to call me at [phone].  Sincerely,   Huston Foley, MD, PhD

## 2020-08-04 NOTE — Patient Instructions (Signed)

## 2020-08-26 ENCOUNTER — Other Ambulatory Visit: Payer: Self-pay | Admitting: Nurse Practitioner

## 2020-08-26 DIAGNOSIS — N529 Male erectile dysfunction, unspecified: Secondary | ICD-10-CM

## 2020-08-26 NOTE — Telephone Encounter (Signed)
Charlotte please advise.  Pt asking for refills for:  Cialis 10 tab   0-refills Last fill-05/05/2020 Last OV-11/02/2019  Tried to contact pt left voicemail to call and schedule an also sent him a My Chart message that he was overdue for a OV with you an to call and schedule, pt is aware this may not be filled without appointment.

## 2020-08-29 ENCOUNTER — Other Ambulatory Visit: Payer: Self-pay | Admitting: Nurse Practitioner

## 2020-08-29 DIAGNOSIS — N529 Male erectile dysfunction, unspecified: Secondary | ICD-10-CM

## 2020-08-30 NOTE — Telephone Encounter (Signed)
Appointment needed pharmacy notified.

## 2020-08-31 ENCOUNTER — Ambulatory Visit (INDEPENDENT_AMBULATORY_CARE_PROVIDER_SITE_OTHER): Payer: BC Managed Care – PPO | Admitting: Neurology

## 2020-08-31 DIAGNOSIS — G4733 Obstructive sleep apnea (adult) (pediatric): Secondary | ICD-10-CM | POA: Diagnosis not present

## 2020-08-31 DIAGNOSIS — R0681 Apnea, not elsewhere classified: Secondary | ICD-10-CM

## 2020-08-31 DIAGNOSIS — R351 Nocturia: Secondary | ICD-10-CM

## 2020-08-31 DIAGNOSIS — R519 Headache, unspecified: Secondary | ICD-10-CM

## 2020-08-31 DIAGNOSIS — Z820 Family history of epilepsy and other diseases of the nervous system: Secondary | ICD-10-CM

## 2020-08-31 DIAGNOSIS — R0683 Snoring: Secondary | ICD-10-CM

## 2020-08-31 DIAGNOSIS — E669 Obesity, unspecified: Secondary | ICD-10-CM

## 2020-08-31 DIAGNOSIS — Z9189 Other specified personal risk factors, not elsewhere classified: Secondary | ICD-10-CM

## 2020-08-31 DIAGNOSIS — G4719 Other hypersomnia: Secondary | ICD-10-CM

## 2020-09-01 ENCOUNTER — Ambulatory Visit (INDEPENDENT_AMBULATORY_CARE_PROVIDER_SITE_OTHER): Payer: BC Managed Care – PPO | Admitting: Nurse Practitioner

## 2020-09-01 ENCOUNTER — Encounter: Payer: Self-pay | Admitting: Nurse Practitioner

## 2020-09-01 ENCOUNTER — Other Ambulatory Visit: Payer: Self-pay

## 2020-09-01 VITALS — BP 144/90 | HR 95 | Temp 97.7°F | Ht 71.0 in | Wt 284.2 lb

## 2020-09-01 DIAGNOSIS — Z6839 Body mass index (BMI) 39.0-39.9, adult: Secondary | ICD-10-CM

## 2020-09-01 DIAGNOSIS — Z23 Encounter for immunization: Secondary | ICD-10-CM

## 2020-09-01 DIAGNOSIS — N529 Male erectile dysfunction, unspecified: Secondary | ICD-10-CM

## 2020-09-01 DIAGNOSIS — Z8042 Family history of malignant neoplasm of prostate: Secondary | ICD-10-CM

## 2020-09-01 DIAGNOSIS — I10 Essential (primary) hypertension: Secondary | ICD-10-CM | POA: Diagnosis not present

## 2020-09-01 DIAGNOSIS — Z125 Encounter for screening for malignant neoplasm of prostate: Secondary | ICD-10-CM | POA: Diagnosis not present

## 2020-09-01 LAB — BASIC METABOLIC PANEL
BUN: 12 mg/dL (ref 6–23)
CO2: 30 mEq/L (ref 19–32)
Calcium: 9.4 mg/dL (ref 8.4–10.5)
Chloride: 98 mEq/L (ref 96–112)
Creatinine, Ser: 1.25 mg/dL (ref 0.40–1.50)
GFR: 72.57 mL/min (ref >60.00)
Glucose, Bld: 122 mg/dL — ABNORMAL HIGH (ref 70–99)
Potassium: 3.4 mEq/L — ABNORMAL LOW (ref 3.5–5.1)
Sodium: 137 mEq/L (ref 135–145)

## 2020-09-01 LAB — HEMOGLOBIN A1C: Hgb A1c MFr Bld: 5.9 % (ref 4.6–6.5)

## 2020-09-01 LAB — PSA: PSA: 1.02 ng/mL (ref 0.10–4.00)

## 2020-09-01 NOTE — Progress Notes (Signed)
Subjective:  Patient ID: Shawn James, male    DOB: 1980/12/07  Age: 39 y.o. MRN: [id number]  CC: Follow-up (6 month f/u on HTN. Pt has been checking BP at home and states the lowest he has seen it is 130/80 and the highest is 140/90.)  HPI  Class 2 severe obesity due to excess calories with serious comorbidity and body mass index (BMI) of 39.0 to 39.9 in adult [facility]) Agreed to referral to weight loss clinic Provided information on Mediterranean diet and need for daily exercise. F/up in 91months  Essential hypertension BP not at goal with clonidine, lisinopril/HCTZ and amlodipine, Reports he is compliant with medication but not diet and exercise No headache or chest pain or LE edema or SOB or dizziness advised about needed dietary changes and exercise Has pending sleep study test results BP Readings from Last 3 Encounters:  09/01/20 (!) 144/90  08/04/20 (!) 146/92  06/05/20 (!) 142/88   Low potassium due to HCTZ Continue amlodipine Stop lisinopril/hctz and clonidine Start lisinopril 20mg  and metoprolol 25mg  XL F/up in 2weeks Will hold on cialis refill at time   BP Readings from Last 3 Encounters:  09/01/20 (!) 144/90  08/04/20 (!) 146/92  06/05/20 (!) 142/88   Wt Readings from Last 3 Encounters:  09/01/20 284 lb 3.2 oz (128.9 kg)  08/04/20 280 lb (127 kg)  06/04/20 260 lb (117.9 kg)   Reviewed past Medical, Social and Family history today.  Outpatient Medications Prior to Visit  Medication Sig Dispense Refill   amLODipine (NORVASC) 10 MG tablet Take 1 tablet (10 mg total) by mouth at bedtime. 90 tablet 3   tadalafil (CIALIS) 5 MG tablet Take 1 tablet (5 mg total) by mouth daily as needed for erectile dysfunction. 10 tablet 0   cloNIDine (CATAPRES) 0.1 MG tablet Take 1 tablet (0.1 mg total) by mouth 2 (two) times daily. 180 tablet 3   lisinopril-hydrochlorothiazide (ZESTORETIC) 20-12.5 MG tablet TAKE 2 TABLETS BY MOUTH ONCE DAILY 180 tablet 1   No  facility-administered medications prior to visit.    ROS See HPI  Objective:  BP (!) 144/90 (BP Location: Left Arm, Patient Position: Sitting, Cuff Size: Large)    Pulse 95    Temp 97.7 F (36.5 C) (Temporal)    Ht 5\' 11"  (1.803 m)    Wt 284 lb 3.2 oz (128.9 kg)    SpO2 97%    BMI 39.64 kg/m   Physical Exam Constitutional:      Appearance: He is obese.  Cardiovascular:     Rate and Rhythm: Normal rate and regular rhythm.     Pulses: Normal pulses.     Heart sounds: Normal heart sounds.     Comments: No JVD Pulmonary:     Effort: Pulmonary effort is normal.     Breath sounds: Normal breath sounds.  Musculoskeletal:     Right lower leg: No edema.     Left lower leg: No edema.  Neurological:     Mental Status: He is alert and oriented to person, place, and time.    Assessment & Plan:  This visit occurred during the SARS-CoV-2 public health emergency.  Safety protocols were in place, including screening questions prior to the visit, additional usage of staff PPE, and extensive cleaning of exam room while observing appropriate contact time as indicated for disinfecting solutions.   Eason was seen today for follow-up.  Diagnoses and all orders for this visit:  Essential hypertension -     Basic  metabolic panel -     lisinopril (ZESTRIL) 20 MG tablet; Take 1 tablet (20 mg total) by mouth daily. -     metoprolol succinate (TOPROL-XL) 25 MG 24 hr tablet; Take 1 tablet (25 mg total) by mouth daily.  Influenza vaccine needed -     Flu Vaccine QUAD 6+ mos PF IM (Fluarix Quad PF)  Erectile dysfunction, unspecified erectile dysfunction type  FHx: cancer of prostate -     PSA  Prostate cancer screening -     PSA  Class 2 severe obesity due to excess calories with serious comorbidity and body mass index (BMI) of 39.0 to 39.9 in adult (HCC) -     Hemoglobin A1c -     Amb Ref to Medical Weight Management    Problem List Items Addressed This Visit      Cardiovascular and  Mediastinum   Essential hypertension - Primary    BP not at goal with clonidine, lisinopril/HCTZ and amlodipine, Reports he is compliant with medication but not diet and exercise No headache or chest pain or LE edema or SOB or dizziness advised about needed dietary changes and exercise Has pending sleep study test results BP Readings from Last 3 Encounters:  09/01/20 (!) 144/90  08/04/20 (!) 146/92  06/05/20 (!) 142/88   Low potassium due to HCTZ Continue amlodipine Stop lisinopril/hctz and clonidine Start lisinopril 20mg  and metoprolol 25mg  XL F/up in 2weeks Will hold on cialis refill at time       Relevant Medications   lisinopril (ZESTRIL) 20 MG tablet   metoprolol succinate (TOPROL-XL) 25 MG 24 hr tablet   Other Relevant Orders   Basic metabolic panel (Completed)     Other   Class 2 severe obesity due to excess calories with serious comorbidity and body mass index (BMI) of 39.0 to 39.9 in adult [facility])    Agreed to referral to weight loss clinic Provided information on Mediterranean diet and need for daily exercise. F/up in 66months      Relevant Orders   Hemoglobin A1c (Completed)   Amb Ref to Medical Weight Management   Erectile dysfunction    Other Visit Diagnoses    Influenza vaccine needed       Relevant Orders   Flu Vaccine QUAD 6+ mos PF IM (Fluarix Quad PF) (Completed)   FHx: cancer of prostate       Relevant Orders   PSA (Completed)   Prostate cancer screening       Relevant Orders   PSA (Completed)      Follow-up: Return in about 3 months (around 12/02/2020) for CPE (fasting).  1month, NP

## 2020-09-01 NOTE — Assessment & Plan Note (Signed)
Agreed to referral to weight loss clinic Provided information on Mediterranean diet and need for daily exercise. F/up in 36months

## 2020-09-01 NOTE — Assessment & Plan Note (Addendum)
BP not at goal with clonidine, lisinopril/HCTZ and amlodipine, Reports he is compliant with medication but not diet and exercise No headache or chest pain or LE edema or SOB or dizziness advised about needed dietary changes and exercise Has pending sleep study test results BP Readings from Last 3 Encounters:  09/01/20 (!) 144/90  08/04/20 (!) 146/92  06/05/20 (!) 142/88   Low potassium due to HCTZ Continue amlodipine Stop lisinopril/hctz and clonidine Start lisinopril 20mg  and metoprolol 25mg  XL F/up in 2weeks Will hold on cialis refill at time

## 2020-09-01 NOTE — Patient Instructions (Addendum)
Glucose and hgbA1c indicates prediabetes. Stable PSA Low potassium due to HCTZ combined with lisinopril. Continue amlodipine Stop lisinopril/hctz and clonidine Start lisinopril 20mg  and metoprolol 25mg  XL F/up in 2weeks Will hold on cialis refill at time  You will be contacted to schedule appt with weight loss clinic Start diet below.  Mediterranean Diet A Mediterranean diet refers to food and lifestyle choices that are based on the traditions of countries located on the . This way of eating has been shown to help prevent certain conditions and improve outcomes for people who have chronic diseases, like kidney disease and heart disease. What are tips for following this plan? Lifestyle  Cook and eat meals together with your family, when possible.  Drink enough fluid to keep your urine clear or pale yellow.  Be physically active every day. This includes: ? Aerobic exercise like running or swimming. ? Leisure activities like gardening, walking, or housework.  Get 7-8 hours of sleep each night.  If recommended by your health care provider, drink red wine in moderation. This means 1 glass a day for nonpregnant women and 2 glasses a day for men. A glass of wine equals 5 oz (150 mL). Reading food labels   Check the serving size of packaged foods. For foods such as rice and pasta, the serving size refers to the amount of cooked product, not dry.  Check the total fat in packaged foods. Avoid foods that have saturated fat or trans fats.  Check the ingredients list for added sugars, such as corn syrup. Shopping  At the grocery store, buy most of your food from the areas near the walls of the store. This includes: ? Fresh fruits and vegetables (produce). ? Grains, beans, nuts, and seeds. Some of these may be available in unpackaged forms or large amounts (in bulk). ? Fresh seafood. ? Poultry and eggs. ? Low-fat dairy products.  Buy whole ingredients instead of  prepackaged foods.  Buy fresh fruits and vegetables in-season from local farmers markets.  Buy frozen fruits and vegetables in resealable bags.  If you do not have access to quality fresh seafood, buy precooked frozen shrimp or canned fish, such as tuna, salmon, or sardines.  Buy small amounts of raw or cooked vegetables, salads, or olives from the deli or salad bar at your store.  Stock your pantry so you always have certain foods on hand, such as olive oil, canned tuna, canned tomatoes, rice, pasta, and beans. Cooking  Cook foods with extra-virgin olive oil instead of using butter or other vegetable oils.  Have meat as a side dish, and have vegetables or grains as your main dish. This means having meat in small portions or adding small amounts of meat to foods like pasta or stew.  Use beans or vegetables instead of meat in common dishes like chili or lasagna.  Experiment with different cooking methods. Try roasting or broiling vegetables instead of steaming or sauteing them.  Add frozen vegetables to soups, stews, pasta, or rice.  Add nuts or seeds for added healthy fat at each meal. You can add these to yogurt, salads, or vegetable dishes.  Marinate fish or vegetables using olive oil, lemon juice, garlic, and fresh herbs. Meal planning   Plan to eat 1 vegetarian meal one day each week. Try to work up to 2 vegetarian meals, if possible.  Eat seafood 2 or more times a week.  Have healthy snacks readily available, such as: ? Vegetable sticks with hummus. ? yogurt. ?  Fruit and nut trail mix.  Eat balanced meals throughout the week. This includes: ? Fruit: 2-3 servings a day ? Vegetables: 4-5 servings a day ? Low-fat dairy: 2 servings a day ? Fish, poultry, or lean meat: 1 serving a day ? Beans and legumes: 2 or more servings a week ? Nuts and seeds: 1-2 servings a day ? Whole grains: 6-8 servings a day ? Extra-virgin olive oil: 3-4 servings a day  Limit red meat  and sweets to only a few servings a month What are my food choices?  Mediterranean diet ? Recommended  Grains: Whole-grain pasta. Brown rice. Bulgar wheat. Polenta. Couscous. Whole-wheat bread. Orpah Cobb.  Vegetables: Artichokes. Beets. Broccoli. Cabbage. Carrots. Eggplant. Green beans. Chard. Kale. Spinach. Onions. Leeks. Peas. Squash. Tomatoes. Peppers. Radishes.  Fruits: Apples. Apricots. Avocado. Berries. Bananas. Cherries. Dates. Figs. Grapes. Lemons. Melon. Oranges. Peaches. Plums. Pomegranate.  Meats and other protein foods: Beans. Almonds. Sunflower seeds. Pine nuts. Peanuts. Cod. Salmon. Scallops. Shrimp. Tuna. Tilapia. Clams. Oysters. Eggs.  Dairy: Low-fat milk. Cheese. Greek yogurt.  Beverages: Water. Red wine. Herbal tea.  Fats and oils: Extra virgin olive oil. Avocado oil. Grape seed oil.  Sweets and desserts: [place] yogurt with honey. Baked apples. Poached pears. Trail mix.  Seasoning and other foods: Basil. Cilantro. Coriander. Cumin. Mint. Parsley. Sage. Rosemary. Tarragon. Garlic. Oregano. Thyme. Pepper. Balsalmic vinegar. Tahini. Hummus. Tomato sauce. Olives. Mushrooms. ? Limit these  Grains: Prepackaged pasta or rice dishes. Prepackaged cereal with added sugar.  Vegetables: Deep fried potatoes (french fries).  Fruits: Fruit canned in syrup.  Meats and other protein foods: Beef. Pork. Lamb. Poultry with skin. Hot dogs. Tomasa Blase.  Dairy: Ice cream. Sour cream. Whole milk.  Beverages: Juice. Sugar-sweetened soft drinks. Beer. Liquor and spirits.  Fats and oils: Butter. Canola oil. Vegetable oil. Beef fat (tallow). Lard.  Sweets and desserts: Cookies. Cakes. Pies. Candy.  Seasoning and other foods: Mayonnaise. Premade sauces and marinades. The items listed may not be a complete list. Talk with your dietitian about what dietary choices are right for you. Summary  The Mediterranean diet includes both food and lifestyle choices.  Eat a variety of fresh  fruits and vegetables, beans, nuts, seeds, and whole grains.  Limit the amount of red meat and sweets that you eat.  Talk with your health care provider about whether it is safe for you to drink red wine in moderation. This means 1 glass a day for nonpregnant women and 2 glasses a day for men. A glass of wine equals 5 oz (150 mL). This information is not intended to replace advice given to you by your health care provider. Make sure you discuss any questions you have with your health care provider. Document Revised: 07/05/2016 Document Reviewed: 06/28/2016 Elsevier Patient Education  2020 [employer].

## 2020-09-02 ENCOUNTER — Other Ambulatory Visit: Payer: Self-pay | Admitting: Nurse Practitioner

## 2020-09-02 ENCOUNTER — Encounter: Payer: Self-pay | Admitting: Nurse Practitioner

## 2020-09-02 DIAGNOSIS — N529 Male erectile dysfunction, unspecified: Secondary | ICD-10-CM

## 2020-09-02 MED ORDER — METOPROLOL SUCCINATE ER 25 MG PO TB24: 25.0000 mg | ORAL_TABLET | Freq: Every day | ORAL | 3 refills | Status: DC

## 2020-09-02 MED ORDER — LISINOPRIL 20 MG PO TABS: 20.0000 mg | ORAL_TABLET | Freq: Every day | ORAL | 3 refills | Status: DC

## 2020-09-05 ENCOUNTER — Encounter: Payer: Self-pay | Admitting: Nurse Practitioner

## 2020-09-05 MED ORDER — LISINOPRIL 20 MG PO TABS: 20.0000 mg | ORAL_TABLET | Freq: Every day | ORAL | 3 refills | Status: DC

## 2020-09-05 MED ORDER — METOPROLOL SUCCINATE ER 25 MG PO TB24: 25.0000 mg | ORAL_TABLET | Freq: Every day | ORAL | 3 refills | Status: DC

## 2020-09-05 NOTE — Addendum Note (Signed)
Addended by: Alysia Penna L on: 09/05/2020 09:22 AM   Modules accepted: Orders

## 2020-09-07 ENCOUNTER — Telehealth: Payer: Self-pay

## 2020-09-07 NOTE — Addendum Note (Signed)
Addended by: Huston Foley on: 09/07/2020 12:56 PM   Modules accepted: Orders

## 2020-09-07 NOTE — Progress Notes (Signed)
Patient referred by Alysia Penna, seen by me on 08/04/20, HST on 08/31/20.    Please call and notify the patient that the recent home sleep test showed obstructive sleep apnea in the severe range. I recommend treatment in the form of autoPAP, which means, that we don't have to bring him in for a sleep study with CPAP, but will let him try an autoPAP machine at home, through a DME company (of his choice, or as per insurance requirement). The DME representative will educate him on how to use the machine, how to put the mask on, etc. I have placed an order in the chart. Please send referral, talk to patient, send report to referring MD. We will need a FU in sleep clinic for 10 weeks post-PAP set up, please arrange that with me or one of our NPs. Thanks,   Huston Foley, MD, PhD [facility])

## 2020-09-07 NOTE — Procedures (Signed)
Sleep Study Report   Patient Information     First Name: Shawn Last Name: James ID: [id number]  Birth Date: 02/02/1981 Age: 39 Gender: Male  Referring Provider: Anne Ng, NP BMI: 52.9 (W=280 lb, H=5' 1'')  Neck Circ.:  19 '' Epworth:  10/24   Sleep Study Information    Study Date: 08/31/20 S/H/A Version: [id number] / 4.2.1023 / 64  History:    39 year old man with a history of hypertension, migraine headaches, bursitis, and obesity, who reports snoring and excessive daytime somnolence. Summary & Diagnosis:    Severe obstructive sleep apnea (OSA)   Recommendations:     This home sleep test demonstrates severe obstructive sleep apnea with a total AHI of 65.1/hour and O2 nadir of 68%. Treatment with positive airway pressure is recommended. The patient will be advised to proceed with an autoPAP titration/trial at home for now. A full night CPAP titration study should be considered to optimize treatment settings download, if needed.  Alternative treatment options besides positive airway pressure treatment are limited secondary to the severity of his sleep apnea.  However, aggressive weight loss is recommended, as weight loss may alleviate the severity of his sleep apnea. Please note that untreated obstructive sleep apnea may carry additional perioperative morbidity. Patients with significant obstructive sleep apnea should receive perioperative PAP therapy and the surgeons and particularly the anesthesiologist should be informed of the diagnosis and the severity of the sleep disordered breathing. The patient should be cautioned not to drive, work at heights, or operate dangerous or heavy equipment when tired or sleepy. Review and reiteration of good sleep hygiene measures should be pursued with any patient. Other causes of the patient's symptoms, including circadian rhythm disturbances, an underlying mood disorder, medication effect and/or an underlying medical problem cannot be ruled out based  on this test. Clinical correlation is recommended. The patient and his referring provider will be notified of the test results. The patient will be seen in follow up in sleep clinic at [facility].  I certify that I have reviewed the raw data recording prior to the issuance of this report in accordance with the standards of the American Academy of Sleep Medicine (AASM).  Huston Foley, MD, PhD [facility]) Diplomat, ABPN (Neurology and Sleep)         Sleep Summary  Oxygen Saturation Statistics   Start Study Time: End Study Time: Total Recording Time:  12:10:36 PM 6:17:48 PM 6 h, 7 min  Total Sleep Time % REM of Sleep Time:  5 h, 23 min  26.9    Mean: 93 Minimum: 68 Maximum: 98  Mean of Desaturations Nadirs (%):   87  Oxygen Desaturation. %:  4-9 10-20 >20 Total  Events Number Total   137  54 68.8 27.1  8 4.0  199 100.0  Oxygen Saturation: <90 <=88 <85 <80 <70  Duration (minutes): Sleep % 29.9 9.2 21.8 8.5 6.7 2.6 3.1 0.9 0.1 0.0     Respiratory Indices      Total Events REM NREM All Night  pRDI: pAHI 3%:  347  345 75.2 75.2 62.1 61.6 65.5 65.1  ODI 4%: pAHI 4%:  199 257 60.6 29.5 37.6 48.5       Pulse Rate Statistics during Sleep (BPM)      Mean: 80 Minimum: 47 Maximum: 109    Indices are calculated using technically valid sleep time of 5 h, 17 min.  pAHI=65.1                                                                                              Mild              Moderate                    Severe                                           PAT  Respiratory Events       60  70  80  90  100          Oxygen Saturation: / Pulse Rate (BPM) / PAT Amplitude  SaO2 (%)        20  40  60  80  100  120     Pulse Rate (BPM)        0  200  400  600  800  1000      PAT  Amp                   12:1  0   12:4  0   13:1  0   13:4  0   14:1  0   14:4  0   15:1  0   15:4  0   16:1  0   16:4  0   17:1  0   17:4  0   18:1  0      Wake / Sleep stages   L Sleep   D Sleep   Wake   REM   Sleep Stages Chart                         Wake  Sleep      Wake  11.85  %    Sleep  88.15  %   Total:  100.00  %                                                       REM  Light  Deep      REM  26.93  %    Light  59.01  %    Deep  14.06  %   Total:  100.00  %                                 Sleep/Wake States  Sleep Stages  Sleep Latency (min):  REM Latency (min):  Number of Wakes:   15   41   13

## 2020-09-07 NOTE — Telephone Encounter (Signed)
-----   Message from Huston Foley, MD sent at 09/07/2020 12:56 PM EDT ----- Patient referred by Alysia Penna, seen by me on 08/04/20, HST on 08/31/20.    Please call and notify the patient that the recent home sleep test showed obstructive sleep apnea in the severe range. I recommend treatment in the form of autoPAP, which means, that we don't have to bring him in for a sleep study with CPAP, but will let him try an autoPAP machine at home, through a DME company (of his choice, or as per insurance requirement). The DME representative will educate him on how to use the machine, how to put the mask on, etc. I have placed an order in the chart. Please send referral, talk to patient, send report to referring MD. We will need a FU in sleep clinic for 10 weeks post-PAP set up, please arrange that with me or one of our NPs. Thanks,   Huston Foley, MD, PhD [facility])

## 2020-09-07 NOTE — Telephone Encounter (Signed)
I attempted to reach the pt.  He was not available and vm was full. I did have the option to send a SMS notification from the attempted call which I did complete.  I will also try the pt back at a later time.

## 2020-09-08 ENCOUNTER — Encounter: Payer: Self-pay | Admitting: Nurse Practitioner

## 2020-09-08 DIAGNOSIS — G4733 Obstructive sleep apnea (adult) (pediatric): Secondary | ICD-10-CM | POA: Insufficient documentation

## 2020-09-14 ENCOUNTER — Encounter: Payer: Self-pay | Admitting: Neurology

## 2020-09-19 ENCOUNTER — Encounter: Payer: Self-pay | Admitting: Neurology

## 2020-09-20 ENCOUNTER — Telehealth: Payer: BC Managed Care – PPO | Admitting: Nurse Practitioner

## 2020-09-21 NOTE — Telephone Encounter (Signed)
Pt returned call. I advised pt that Dr. Frances Furbish reviewed their sleep study results and found that pt has severe sleep apnea. Dr. Frances Furbish recommends that pt starts auto CPAP 6-18 cm water pressure. I reviewed PAP compliance expectations with the pt. Pt is agreeable to starting a CPAP. I advised pt that an order will be sent to a DME, [facility], and [facility] will call the pt within about one week after they file with the pt's insurance. [employer] will show the pt how to use the machine, fit for masks, and troubleshoot the CPAP if needed. A follow up appt was made for insurance purposes with Shawnie Dapper, NP on Feb 7,2022 at 7:30 am. Pt verbalized understanding to arrive 15 minutes early and bring their CPAP. A letter with all of this information in it will be mailed to the pt as a reminder. I verified with the pt that the address we have on file is correct. Pt verbalized understanding of results. Pt had no questions at this time but was encouraged to call back if questions arise. I have sent the order to [employer] and have received confirmation that they have received the order.

## 2020-10-07 ENCOUNTER — Encounter: Payer: Self-pay | Admitting: Nurse Practitioner

## 2020-10-07 ENCOUNTER — Telehealth: Payer: Self-pay | Admitting: Nurse Practitioner

## 2020-10-07 NOTE — Telephone Encounter (Signed)
Pt was no show for appt 09/20/2020 for virtual 2 wk f/u. 2nd occurrence. Fee charged Letter mailed noting additional no shows may result in dismissal.  PCP,  Please reply back with corresponding letter matching appropriate follow up needs.  A - No follow up necessary B - Follow up urgent - locate patient immediately to schedule appointment. C - Follow up necessary. Contact patient and schedule visit w/in 7 days. D - Follow up necessary. Contact patient and schedule visit w/in 2-4 weeks.  E - Follow up necessary. Contact patient and schedule visit w/in 3 months.

## 2020-10-07 NOTE — Telephone Encounter (Signed)
C-

## 2020-10-18 ENCOUNTER — Telehealth: Payer: BC Managed Care – PPO | Admitting: Nurse Practitioner

## 2020-10-18 ENCOUNTER — Encounter: Payer: Self-pay | Admitting: Nurse Practitioner

## 2020-10-26 ENCOUNTER — Encounter (INDEPENDENT_AMBULATORY_CARE_PROVIDER_SITE_OTHER): Payer: Self-pay

## 2020-12-13 DIAGNOSIS — H35033 Hypertensive retinopathy, bilateral: Secondary | ICD-10-CM | POA: Diagnosis not present

## 2020-12-13 DIAGNOSIS — H348311 Tributary (branch) retinal vein occlusion, right eye, with retinal neovascularization: Secondary | ICD-10-CM | POA: Diagnosis not present

## 2020-12-13 DIAGNOSIS — H4311 Vitreous hemorrhage, right eye: Secondary | ICD-10-CM | POA: Diagnosis not present

## 2020-12-13 DIAGNOSIS — H43821 Vitreomacular adhesion, right eye: Secondary | ICD-10-CM | POA: Diagnosis not present

## 2020-12-16 ENCOUNTER — Encounter (INDEPENDENT_AMBULATORY_CARE_PROVIDER_SITE_OTHER): Payer: Self-pay

## 2020-12-22 NOTE — Progress Notes (Deleted)
PATIENT: Shawn James DOB: 03/02/1981  REASON FOR VISIT: follow up HISTORY FROM: patient  No chief complaint on file.    HISTORY OF PRESENT ILLNESS: Today 12/22/20 Shawn James is a 40 y.o. male here today for follow up for OSA on CPAP.  HST on 08/31/2020 showed "severe obstructive sleep apnea with a total AHI of 65.1/hour and O2 nadir of 68%". AutoPAP ordered.   Compliance report dated    HISTORY: (copied from Dr Teofilo Pod previous note)  Dear Claris Gower,   I saw your patient, Shawn James, upon your kind request, in my Sleep clinic today for initial consultation of his sleep disorder, in particular, concern for underlying obstructive sleep apnea.  The patient is unaccompanied today.  As you know, someone is a 40 year old right-handed gentleman with an underlying medical history of hypertension, migraine headaches, bursitis, and obesity, who reports snoring and excessive daytime somnolence.  I reviewed your office note from 11/02/2019.  His Epworth sleepiness score is 10 out of 24, fatigue severity score is 25 out of 63.  He is single, lives alone but does report that his family and friends noticed his snoring and that his mother has mentioned pauses in his breathing while he is asleep.  He does not feel fully rested.  He does report working 2 jobs, he works full-time in Office manager and also has a part-time job at [employer].  He goes to bed around 11:30 PM and rise time is generally around 6:45 AM.  He has nocturia about twice per average night and has woken up occasionally with a headache, sometimes it feels like one of his migraine-like headaches and sometimes it is a more dull and achy headache.  He takes over-the-counter medication if needed for headaches.  He has been on blood pressure medication, currently a total of 4 blood pressure medications.  He takes his medications on a regular basis.  He is a non-smoker and drinks alcohol very occasionally, does drink caffeine in the form of ice tea,  typically 4 glasses/day on average.  He is working on weight loss and has joined a gym.  His brother has sleep apnea and uses a CPAP machine.  The patient would be willing to consider CPAP therapy.  He has a TV in the bedroom and generally falls asleep with the TV on but the TV turns off on a timer.  He recently obtained a puppy.    REVIEW OF SYSTEMS: Out of a complete 14 system review of symptoms, the patient complains only of the following symptoms, and all other reviewed systems are negative.   ALLERGIES: No Known Allergies  HOME MEDICATIONS: Outpatient Medications Prior to Visit  Medication Sig Dispense Refill  . amLODipine (NORVASC) 10 MG tablet Take 1 tablet (10 mg total) by mouth at bedtime. 90 tablet 3  . lisinopril (ZESTRIL) 20 MG tablet Take 1 tablet (20 mg total) by mouth daily. 90 tablet 3  . metoprolol succinate (TOPROL-XL) 25 MG 24 hr tablet Take 1 tablet (25 mg total) by mouth daily. 90 tablet 3  . tadalafil (CIALIS) 5 MG tablet Take 1 tablet (5 mg total) by mouth daily as needed for erectile dysfunction. 10 tablet 0   No facility-administered medications prior to visit.    PAST MEDICAL HISTORY: Past Medical History:  Diagnosis Date  . Bursitis   . Chicken pox   . Hypertension   . Migraine   . Obesity     PAST SURGICAL HISTORY: No past surgical history on file.  FAMILY HISTORY: Family History  Problem Relation Age of Onset  . Heart disease Mother   . Diabetes Mother   . Heart disease Father   . Diabetes Father   . Cancer Father        prostate cancer  . Bell's palsy Brother     SOCIAL HISTORY: Social History   Socioeconomic History  . Marital status: Single    Spouse name: Not on file  . Number of children: 1  . Years of education: 94  . Highest education level: Not on file  Occupational History  . Occupation: Electrical engineer  . Occupation: Sport and exercise psychologist  Tobacco Use  . Smoking status: Former Smoker    Years: 2.00    Types: Cigars    Quit  date: 11/20/2015    Years since quitting: 5.0  . Smokeless tobacco: Never Used  Vaping Use  . Vaping Use: Never used  Substance and Sexual Activity  . Alcohol use: Yes    Alcohol/week: 1.0 standard drink    Types: 1 Cans of beer per week    Comment: twice a week  . Drug use: Never  . Sexual activity: Not on file  Other Topics Concern  . Not on file  Social History Narrative   Born and raised in [place], [place]. Currently lives in an apartment with by himself. No pets. Fun: Movies, eating out, travel   Denies religious beliefs that would effect health care.    Social Determinants of Health   Financial Resource Strain: Not on file  Food Insecurity: Not on file  Transportation Needs: Not on file  Physical Activity: Not on file  Stress: Not on file  Social Connections: Not on file  Intimate Partner Violence: Not on file     PHYSICAL EXAM  There were no vitals filed for this visit. There is no height or weight on file to calculate BMI.  Generalized: Well developed, in no acute distress  Cardiology: normal rate and rhythm, no murmur noted Respiratory: clear to auscultation bilaterally  Neurological examination  Mentation: Alert oriented to time, place, history taking. Follows all commands speech and language fluent Cranial nerve II-XII: Pupils were equal round reactive to light. Extraocular movements were full, visual field were full  Motor: The motor testing reveals 5 over 5 strength of all 4 extremities. Good symmetric motor tone is noted throughout.  Gait and station: Gait is normal.    DIAGNOSTIC DATA (LABS, IMAGING, TESTING) - I reviewed patient records, labs, notes, testing and imaging myself where available.  No flowsheet data found.   Lab Results  Component Value Date   WBC 13.6 (H) 02/23/2020   HGB 15.9 02/23/2020   HCT 50.0 02/23/2020   MCV 83.2 02/23/2020   PLT 273 02/23/2020      Component Value Date/Time   NA 137 09/01/2020 0944   K 3.4 (L)  09/01/2020 0944   CL 98 09/01/2020 0944   CO2 30 09/01/2020 0944   GLUCOSE 122 (H) 09/01/2020 0944   BUN 12 09/01/2020 0944   CREATININE 1.25 09/01/2020 0944   CALCIUM 9.4 09/01/2020 0944   PROT 7.3 02/23/2020 2235   ALBUMIN 4.2 02/23/2020 2235   AST 40 02/23/2020 2235   ALT 52 (H) 02/23/2020 2235   ALKPHOS 45 02/23/2020 2235   BILITOT 0.5 02/23/2020 2235   GFRNONAA >60 02/23/2020 2235   GFRAA >60 02/23/2020 2235   No results found for: CHOL, HDL, LDLCALC, LDLDIRECT, TRIG, CHOLHDL Lab Results  Component Value Date  HGBA1C 5.9 09/01/2020   No results found for: VITAMINB12 No results found for: TSH   ASSESSMENT AND PLAN 40 y.o. year old male  has a past medical history of Bursitis, Chicken pox, Hypertension, Migraine, and Obesity. here with ***  No diagnosis found.   Merwyn Hodapp is doing well on CPAP therapy. Compliance report reveals ***. *** was encouraged to continue using CPAP nightly and for greater than 4 hours each night. We will update supply orders as indicated. Risks of untreated sleep apnea review and education materials provided. Healthy lifestyle habits encouraged. *** will follow up in ***, sooner if needed. *** verbalizes understanding and agreement with this plan.   No orders of the defined types were placed in this encounter.    No orders of the defined types were placed in this encounter.     I spent 15 minutes with the patient. 50% of this time was spent counseling and educating patient on plan of care and medications.    Shawnie Dapper, FNP-C 12/22/2020, 3:01 PM [facility] [address], [address] [place], [place] [address] [phone]

## 2020-12-22 NOTE — Patient Instructions (Incomplete)
Please continue using your CPAP regularly. While your insurance requires that you use CPAP at least 4 hours each night on 70% of the nights, I recommend, that you not skip any nights and use it throughout the night if you can. Getting used to CPAP and staying with the treatment [place] term does take time and patience and discipline. Untreated obstructive sleep apnea when it is moderate to severe can have an adverse impact on cardiovascular health and raise her risk for heart disease, arrhythmias, hypertension, congestive heart failure, stroke and diabetes. Untreated obstructive sleep apnea causes sleep disruption, nonrestorative sleep, and sleep deprivation. This can have an impact on your day to day functioning and cause daytime sleepiness and impairment of cognitive function, memory loss, mood disturbance, and problems focussing. Using CPAP regularly can improve these symptoms.   Follow up in   Sleep Apnea Sleep apnea affects breathing during sleep. It causes breathing to stop for a short time or to become shallow. It can also increase the risk of:  Heart attack.  Stroke.  Being very overweight (obese).  Diabetes.  Heart failure.  Irregular heartbeat. The goal of treatment is to help you breathe normally again. What are the causes? There are three kinds of sleep apnea:  Obstructive sleep apnea. This is caused by a blocked or collapsed airway.  Central sleep apnea. This happens when the brain does not send the right signals to the muscles that control breathing.  Mixed sleep apnea. This is a combination of obstructive and central sleep apnea. The most common cause of this condition is a collapsed or blocked airway. This can happen if:  Your throat muscles are too relaxed.  Your tongue and tonsils are too large.  You are overweight.  Your airway is too small.   What increases the risk?  Being overweight.  Smoking.  Having a small airway.  Being older.  Being  male.  Drinking alcohol.  Taking medicines to calm yourself (sedatives or tranquilizers).  Having family members with the condition. What are the signs or symptoms?  Trouble staying asleep.  Being sleepy or tired during the day.  Getting angry a lot.  Loud snoring.  Headaches in the morning.  Not being able to focus your mind (concentrate).  Forgetting things.  Less interest in sex.  Mood swings.  Personality changes.  Feelings of sadness (depression).  Waking up a lot during the night to pee (urinate).  Dry mouth.  Sore throat. How is this diagnosed?  Your medical history.  A physical exam.  A test that is done when you are sleeping (sleep study). The test is most often done in a sleep lab but may also be done at home. How is this treated?  Sleeping on your side.  Using a medicine to get rid of mucus in your nose (decongestant).  Avoiding the use of alcohol, medicines to help you relax, or certain pain medicines (narcotics).  Losing weight, if needed.  Changing your diet.  Not smoking.  Using a machine to open your airway while you sleep, such as: ? An oral appliance. This is a mouthpiece that shifts your lower jaw forward. ? A CPAP device. This device blows air through a mask when you breathe out (exhale). ? An EPAP device. This has valves that you put in each nostril. ? A BPAP device. This device blows air through a mask when you breathe in (inhale) and breathe out.  Having surgery if other treatments do not work. It is important  to get treatment for sleep apnea. Without treatment, it can lead to:  High blood pressure.  Coronary artery disease.  In men, not being able to have an erection (impotence).  Reduced thinking ability.   Follow these instructions at home: Lifestyle  Make changes that your doctor recommends.  Eat a healthy diet.  Lose weight if needed.  Avoid alcohol, medicines to help you relax, and some pain  medicines.  Do not use any products that contain nicotine or tobacco, such as cigarettes, e-cigarettes, and chewing tobacco. If you need help quitting, ask your doctor. General instructions  Take over-the-counter and prescription medicines only as told by your doctor.  If you were given a machine to use while you sleep, use it only as told by your doctor.  If you are having surgery, make sure to tell your doctor you have sleep apnea. You may need to bring your device with you.  Keep all follow-up visits as told by your doctor. This is important. Contact a doctor if:  The machine that you were given to use during sleep bothers you or does not seem to be working.  You do not get better.  You get worse. Get help right away if:  Your chest hurts.  You have trouble breathing in enough air.  You have an uncomfortable feeling in your back, arms, or stomach.  You have trouble talking.  One side of your body feels weak.  A part of your face is hanging down. These symptoms may be an emergency. Do not wait to see if the symptoms will go away. Get medical help right away. Call your local emergency services (911 in the [place] Do not drive yourself to the hospital. Summary  This condition affects breathing during sleep.  The most common cause is a collapsed or blocked airway.  The goal of treatment is to help you breathe normally while you sleep. This information is not intended to replace advice given to you by your health care provider. Make sure you discuss any questions you have with your health care provider. Document Revised: 08/22/2018 Document Reviewed: 07/01/2018 Elsevier Patient Education  2021 [employer]  

## 2020-12-23 ENCOUNTER — Telehealth: Payer: Self-pay

## 2020-12-23 NOTE — Telephone Encounter (Signed)
I called patient in regards to his appointment coming up onFebruary 7.  This is an initial CPAP appointment and I could not get any data. Called patient to see if he had received CPAP or is using it and he did not answer, left voicemail for patient to give [place] a call back

## 2020-12-26 ENCOUNTER — Encounter: Payer: Self-pay | Admitting: Family Medicine

## 2020-12-26 ENCOUNTER — Ambulatory Visit: Payer: Self-pay | Admitting: Family Medicine

## 2020-12-26 DIAGNOSIS — G4733 Obstructive sleep apnea (adult) (pediatric): Secondary | ICD-10-CM

## 2021-03-06 DIAGNOSIS — G4733 Obstructive sleep apnea (adult) (pediatric): Secondary | ICD-10-CM | POA: Diagnosis not present

## 2021-03-19 DIAGNOSIS — G4733 Obstructive sleep apnea (adult) (pediatric): Secondary | ICD-10-CM | POA: Diagnosis not present

## 2021-04-19 DIAGNOSIS — G4733 Obstructive sleep apnea (adult) (pediatric): Secondary | ICD-10-CM | POA: Diagnosis not present

## 2021-05-19 DIAGNOSIS — G4733 Obstructive sleep apnea (adult) (pediatric): Secondary | ICD-10-CM | POA: Diagnosis not present

## 2021-08-15 NOTE — Telephone Encounter (Signed)
Mr Cilento was set up 03/06/21 with a machine but has been non compliant and not using the machine. He has not scheduled his follow up visit either.

## 2022-04-26 NOTE — Progress Notes (Deleted)
    [facility] [address] [place] [address] Phone: [phone]   Assessment and Plan:     There are no diagnoses linked to this encounter.  ***   Pertinent previous records reviewed include ***   Follow Up: ***     Subjective:   I, Shawn James, am serving as a Neurosurgeon for Shawn James  Chief Complaint: shoulder and knee pain   HPI:   04/27/2022 Patient is a 41 year old male complaining of shoulder and knee pain. Patient states  Relevant Historical Information: ***  Additional pertinent review of systems negative.   Current Outpatient Medications:    amLODipine (NORVASC) 10 MG tablet, Take 1 tablet (10 mg total) by mouth at bedtime., Disp: 90 tablet, Rfl: 3   lisinopril (ZESTRIL) 20 MG tablet, Take 1 tablet (20 mg total) by mouth daily., Disp: 90 tablet, Rfl: 3   metoprolol succinate (TOPROL-XL) 25 MG 24 hr tablet, Take 1 tablet (25 mg total) by mouth daily., Disp: 90 tablet, Rfl: 3   tadalafil (CIALIS) 5 MG tablet, Take 1 tablet (5 mg total) by mouth daily as needed for erectile dysfunction., Disp: 10 tablet, Rfl: 0   Objective:     There were no vitals filed for this visit.    There is no height or weight on file to calculate BMI.    Physical Exam:    ***   Electronically signed by:  [facility] 7:56 AM 04/26/22

## 2022-04-27 ENCOUNTER — Ambulatory Visit: Payer: Self-pay | Admitting: Sports Medicine

## 2022-05-02 NOTE — Progress Notes (Deleted)
    Aleen [name] [address] [place] [address] Phone: [phone]   Assessment and Plan:     There are no diagnoses linked to this encounter.  ***   Pertinent previous records reviewed include ***   Follow Up: ***     Subjective:   I, Shawn James, am serving as a Neurosurgeon for Shawn James  Chief Complaint: shoulder and knee pain   HPI:  05/03/2022 Patient is a 41 year old male complaining of shoulder and knee pain. Patient states  Relevant Historical Information: ***  Additional pertinent review of systems negative.   Current Outpatient Medications:    amLODipine (NORVASC) 10 MG tablet, Take 1 tablet (10 mg total) by mouth at bedtime., Disp: 90 tablet, Rfl: 3   lisinopril (ZESTRIL) 20 MG tablet, Take 1 tablet (20 mg total) by mouth daily., Disp: 90 tablet, Rfl: 3   metoprolol succinate (TOPROL-XL) 25 MG 24 hr tablet, Take 1 tablet (25 mg total) by mouth daily., Disp: 90 tablet, Rfl: 3   tadalafil (CIALIS) 5 MG tablet, Take 1 tablet (5 mg total) by mouth daily as needed for erectile dysfunction., Disp: 10 tablet, Rfl: 0   Objective:     There were no vitals filed for this visit.    There is no height or weight on file to calculate BMI.    Physical Exam:    ***   Electronically signed by:  Aleen Sells [name] 7:51 AM 05/02/22

## 2022-05-03 ENCOUNTER — Ambulatory Visit: Payer: Self-pay | Admitting: Sports Medicine

## 2022-05-07 ENCOUNTER — Encounter (HOSPITAL_BASED_OUTPATIENT_CLINIC_OR_DEPARTMENT_OTHER): Payer: Self-pay | Admitting: Urology

## 2022-05-07 ENCOUNTER — Emergency Department (HOSPITAL_BASED_OUTPATIENT_CLINIC_OR_DEPARTMENT_OTHER): Payer: BC Managed Care – PPO

## 2022-05-07 ENCOUNTER — Inpatient Hospital Stay (HOSPITAL_BASED_OUTPATIENT_CLINIC_OR_DEPARTMENT_OTHER)
Admission: EM | Admit: 2022-05-07 | Discharge: 2022-05-11 | DRG: 305 | Disposition: A | Discharge status: Home / Self Care | Payer: BC Managed Care – PPO | Attending: Internal Medicine | Admitting: Internal Medicine

## 2022-05-07 ENCOUNTER — Other Ambulatory Visit: Payer: Self-pay

## 2022-05-07 DIAGNOSIS — Z8249 Family history of ischemic heart disease and other diseases of the circulatory system: Secondary | ICD-10-CM

## 2022-05-07 DIAGNOSIS — I700 Atherosclerosis of aorta: Secondary | ICD-10-CM | POA: Diagnosis present

## 2022-05-07 DIAGNOSIS — Z79899 Other long term (current) drug therapy: Secondary | ICD-10-CM

## 2022-05-07 DIAGNOSIS — Z87891 Personal history of nicotine dependence: Secondary | ICD-10-CM

## 2022-05-07 DIAGNOSIS — E66812 Obesity, class 2: Secondary | ICD-10-CM

## 2022-05-07 DIAGNOSIS — Z833 Family history of diabetes mellitus: Secondary | ICD-10-CM

## 2022-05-07 DIAGNOSIS — I25110 Atherosclerotic heart disease of native coronary artery with unstable angina pectoris: Secondary | ICD-10-CM | POA: Diagnosis not present

## 2022-05-07 DIAGNOSIS — G4733 Obstructive sleep apnea (adult) (pediatric): Secondary | ICD-10-CM

## 2022-05-07 DIAGNOSIS — R778 Other specified abnormalities of plasma proteins: Secondary | ICD-10-CM | POA: Diagnosis present

## 2022-05-07 DIAGNOSIS — I10 Essential (primary) hypertension: Secondary | ICD-10-CM | POA: Diagnosis not present

## 2022-05-07 DIAGNOSIS — Z6839 Body mass index (BMI) 39.0-39.9, adult: Secondary | ICD-10-CM

## 2022-05-07 DIAGNOSIS — I169 Hypertensive crisis, unspecified: Secondary | ICD-10-CM | POA: Diagnosis present

## 2022-05-07 DIAGNOSIS — Z8042 Family history of malignant neoplasm of prostate: Secondary | ICD-10-CM

## 2022-05-07 DIAGNOSIS — I161 Hypertensive emergency: Secondary | ICD-10-CM | POA: Diagnosis not present

## 2022-05-07 DIAGNOSIS — R7989 Other specified abnormal findings of blood chemistry: Secondary | ICD-10-CM | POA: Diagnosis present

## 2022-05-07 DIAGNOSIS — R079 Chest pain, unspecified: Secondary | ICD-10-CM | POA: Diagnosis present

## 2022-05-07 DIAGNOSIS — E785 Hyperlipidemia, unspecified: Secondary | ICD-10-CM | POA: Diagnosis not present

## 2022-05-07 LAB — CBC
HCT: 48.7 % (ref 39.0–52.0)
Hemoglobin: 16.1 g/dL (ref 13.0–17.0)
MCH: 26.6 pg (ref 26.0–34.0)
MCHC: 33.1 g/dL (ref 30.0–36.0)
MCV: 80.4 fL (ref 80.0–100.0)
Platelets: 238 10*3/uL (ref 150–400)
RBC: 6.06 MIL/uL — ABNORMAL HIGH (ref 4.22–5.81)
RDW: 13.2 % (ref 11.5–15.5)
WBC: 9.9 10*3/uL (ref 4.0–10.5)
nRBC: 0 % (ref 0.0–0.2)

## 2022-05-07 LAB — BASIC METABOLIC PANEL
Anion gap: 8 (ref 5–15)
BUN: 15 mg/dL (ref 6–20)
CO2: 26 mmol/L (ref 22–32)
Calcium: 8.9 mg/dL (ref 8.9–10.3)
Chloride: 103 mmol/L (ref 98–111)
Creatinine, Ser: 1.35 mg/dL — ABNORMAL HIGH (ref 0.61–1.24)
GFR, Estimated: >60 mL/min (ref >60)
Glucose, Bld: 100 mg/dL — ABNORMAL HIGH (ref 70–99)
Potassium: 3.3 mmol/L — ABNORMAL LOW (ref 3.5–5.1)
Sodium: 137 mmol/L (ref 135–145)

## 2022-05-07 LAB — TROPONIN I (HIGH SENSITIVITY)
Troponin I (High Sensitivity): 77 ng/L — ABNORMAL HIGH (ref <18)
Troponin I (High Sensitivity): 116 ng/L (ref <18)

## 2022-05-07 LAB — D-DIMER, QUANTITATIVE: D-Dimer, Quant: 0.31 ug/mL-FEU (ref 0.00–0.50)

## 2022-05-07 MED ORDER — HYDRALAZINE HCL 20 MG/ML IJ SOLN
20.0000 mg | Freq: Once | INTRAMUSCULAR | Status: AC
Start: 2022-05-07 — End: 2022-05-07
  Administered 2022-05-07: 20 mg via INTRAVENOUS
  Filled 2022-05-07: qty 1

## 2022-05-07 MED ORDER — NICARDIPINE HCL IN NACL 20-0.86 MG/200ML-% IV SOLN
3.0000 mg/h | INTRAVENOUS | Status: DC
  Administered 2022-05-07: 5 mg/h via INTRAVENOUS
  Filled 2022-05-07: qty 200

## 2022-05-07 MED ORDER — IOHEXOL 350 MG/ML SOLN
100.0000 mL | Freq: Once | INTRAVENOUS | Status: AC | PRN
  Administered 2022-05-08: 100 mL via INTRAVENOUS

## 2022-05-07 MED ORDER — NITROGLYCERIN IN D5W 200-5 MCG/ML-% IV SOLN
0.0000 ug/min | INTRAVENOUS | Status: DC
  Administered 2022-05-07: 5 ug/min via INTRAVENOUS
  Filled 2022-05-07: qty 250

## 2022-05-07 NOTE — ED Triage Notes (Signed)
Central chest pain that started 2 hrs PTA, no radiation of pain  Denies SOB Hypertensive at triage Systolic >200

## 2022-05-07 NOTE — ED Provider Notes (Signed)
[facility] EMERGENCY DEPARTMENT Provider Note   CSN: [id number] Arrival date & time: 05/07/22  1923     History {Add pertinent medical, surgical, social history, OB history to HPI:1} Chief Complaint  Patient presents with   Chest Pain   Hypertension    Shawn James is a 41 y.o. male w/ hx of obesity, HTN, presenting to ED with chest pain and hypertension.  Patient reports he was driving his car around 5 PM and began to have substernal chest pain, which initially was sharp and sudden onset, but gradually became more of a pressure sensation.  He has never had the feeling before.  He went to urgent care, was given full dose aspirin and sent to the ED for further evaluation.  He reports that he feels asymptomatic now, denies lightheadedness at this point.  Denies nausea or vomiting.  He has never had a cardiac work-up.  He denies personal family history of DVT or PE.  He reports he does have high blood pressure reports he only takes lisinopril at home.  Medical record review shows that he was also prescribed amlodipine and metoprolol.  HPI     Home Medications Prior to Admission medications   Medication Sig Start Date End Date Taking? Authorizing Provider  amLODipine (NORVASC) 10 MG tablet Take 1 tablet (10 mg total) by mouth at bedtime. 11/02/19   Nche, Bonna Gains, NP  lisinopril (ZESTRIL) 20 MG tablet Take 1 tablet (20 mg total) by mouth daily. 09/05/20   Nche, Bonna Gains, NP  metoprolol succinate (TOPROL-XL) 25 MG 24 hr tablet Take 1 tablet (25 mg total) by mouth daily. 09/05/20   Nche, Bonna Gains, NP  tadalafil (CIALIS) 5 MG tablet Take 1 tablet (5 mg total) by mouth daily as needed for erectile dysfunction. 05/05/20   Nche, Bonna Gains, NP  fluticasone (FLONASE) 50 MCG/ACT nasal spray Place 2 sprays into both nostrils daily. Patient not taking: Reported on 02/24/2020 03/28/18 06/05/20  Meryl Dare, NP      Allergies    Patient has no known  allergies.    Review of Systems   Review of Systems  Physical Exam Updated Vital Signs BP (!) 180/126   Pulse 80   Temp 98 F (36.7 C) (Oral)   Resp (!) 21   Ht 5\' 11"  (1.803 m)   Wt 131.5 kg   SpO2 99%   BMI 40.45 kg/m  Physical Exam Constitutional:      General: He is not in acute distress.    Appearance: He is obese.  HENT:     Head: Normocephalic and atraumatic.  Eyes:     Conjunctiva/sclera: Conjunctivae normal.     Pupils: Pupils are equal, round, and reactive to light.  Cardiovascular:     Rate and Rhythm: Normal rate and regular rhythm.  Pulmonary:     Effort: Pulmonary effort is normal. No respiratory distress.  Abdominal:     General: There is no distension.     Tenderness: There is no abdominal tenderness.  Skin:    General: Skin is warm and dry.  Neurological:     General: No focal deficit present.     Mental Status: He is alert. Mental status is at baseline.  Psychiatric:        Mood and Affect: Mood normal.        Behavior: Behavior normal.     ED Results / Procedures / Treatments   Labs (all labs ordered are listed, but only abnormal results  are displayed) Labs Reviewed  BASIC METABOLIC PANEL - Abnormal; Notable for the following components:      Result Value   Potassium 3.3 (*)    Glucose, Bld 100 (*)    Creatinine, Ser 1.35 (*)    All other components within normal limits  CBC - Abnormal; Notable for the following components:   RBC 6.06 (*)    All other components within normal limits  TROPONIN I (HIGH SENSITIVITY) - Abnormal; Notable for the following components:   Troponin I (High Sensitivity) 77 (*)    All other components within normal limits  TROPONIN I (HIGH SENSITIVITY)    EKG EKG Interpretation  Date/Time:  Monday May 07 2022 19:35:07 EDT Ventricular Rate:  82 PR Interval:  164 QRS Duration: 94 QT Interval:  352 QTC Calculation: 412 R Axis:   36 Text Interpretation: Sinus rhythm Probable left atrial enlargement  Abnormal T, consider ischemia, diffuse leads Borderline ST elevation, anterior leads Confirmed by Alvester Chou [phone]) on 05/07/2022 8:56:18 PM  Radiology DG Chest 2 View  Result Date: 05/07/2022 CLINICAL DATA:  Chest pain starting 2 hours prior to admission. EXAM: CHEST - 2 VIEW COMPARISON:  02/24/2020 FINDINGS: The heart size and mediastinal contours are within normal limits. Both lungs are clear. The visualized skeletal structures are unremarkable. IMPRESSION: No active cardiopulmonary disease. Electronically Signed   By: Burman Nieves M.D.   On: 05/07/2022 19:47    Procedures Procedures  {Document cardiac monitor, telemetry assessment procedure when appropriate:1}  Medications Ordered in ED Medications - No data to display  ED Course/ Medical Decision Making/ A&P                           Medical Decision Making Amount and/or Complexity of Data Reviewed Labs: ordered. Radiology: ordered.   This patient presents to the ED with concern for ***. This involves an extensive number of treatment options, and is a complaint that carries with it a high risk of complications and morbidity.  The differential diagnosis includes ***  Co-morbidities that complicate the patient evaluation: ***  Additional history obtained from ***  External records from outside source obtained and reviewed including ***  I ordered and personally interpreted labs.  The pertinent results include:  ***  I ordered imaging studies including *** I independently visualized and interpreted imaging which showed *** I agree with the radiologist interpretation  The patient was maintained on a cardiac monitor.  I personally viewed and interpreted the cardiac monitored which showed an underlying rhythm of: ***  Per my interpretation the patient's ECG shows ***  I ordered medication including ***  for *** I have reviewed the patients home medicines and have made adjustments as needed  Test Considered:  ***  I requested consultation with the ***,  and discussed lab and imaging findings as well as pertinent plan - they recommend: ***  After the interventions noted above, I reevaluated the patient and found that they have: {resolved/improved/worsened:23923::"improved"}  Social Determinants of Health:***  Dispostion:  After consideration of the diagnostic results and the patients response to treatment, I feel that the patent would benefit from ***.   {Document critical care time when appropriate:1} {Document review of labs and clinical decision tools ie heart score, Chads2Vasc2 etc:1}  {Document your independent review of radiology images, and any outside records:1} {Document your discussion with family members, caretakers, and with consultants:1} {Document social determinants of health affecting pt's care:1} {Document your decision making  why or why not admission, treatments were needed:1} Final Clinical Impression(s) / ED Diagnoses Final diagnoses:  None    Rx / DC Orders ED Discharge Orders     None

## 2022-05-08 ENCOUNTER — Observation Stay (HOSPITAL_COMMUNITY): Payer: BC Managed Care – PPO

## 2022-05-08 ENCOUNTER — Encounter (HOSPITAL_COMMUNITY): Payer: Self-pay | Admitting: Internal Medicine

## 2022-05-08 DIAGNOSIS — I161 Hypertensive emergency: Secondary | ICD-10-CM | POA: Diagnosis present

## 2022-05-08 DIAGNOSIS — I25110 Atherosclerotic heart disease of native coronary artery with unstable angina pectoris: Secondary | ICD-10-CM | POA: Diagnosis present

## 2022-05-08 DIAGNOSIS — Z87891 Personal history of nicotine dependence: Secondary | ICD-10-CM | POA: Diagnosis not present

## 2022-05-08 DIAGNOSIS — I700 Atherosclerosis of aorta: Secondary | ICD-10-CM | POA: Diagnosis present

## 2022-05-08 DIAGNOSIS — G4733 Obstructive sleep apnea (adult) (pediatric): Secondary | ICD-10-CM | POA: Diagnosis present

## 2022-05-08 DIAGNOSIS — R778 Other specified abnormalities of plasma proteins: Secondary | ICD-10-CM | POA: Diagnosis not present

## 2022-05-08 DIAGNOSIS — I10 Essential (primary) hypertension: Secondary | ICD-10-CM | POA: Diagnosis present

## 2022-05-08 DIAGNOSIS — Z833 Family history of diabetes mellitus: Secondary | ICD-10-CM | POA: Diagnosis not present

## 2022-05-08 DIAGNOSIS — Z8249 Family history of ischemic heart disease and other diseases of the circulatory system: Secondary | ICD-10-CM | POA: Diagnosis not present

## 2022-05-08 DIAGNOSIS — R079 Chest pain, unspecified: Secondary | ICD-10-CM

## 2022-05-08 DIAGNOSIS — Z6839 Body mass index (BMI) 39.0-39.9, adult: Secondary | ICD-10-CM | POA: Diagnosis not present

## 2022-05-08 DIAGNOSIS — E785 Hyperlipidemia, unspecified: Secondary | ICD-10-CM | POA: Diagnosis present

## 2022-05-08 DIAGNOSIS — I169 Hypertensive crisis, unspecified: Secondary | ICD-10-CM | POA: Diagnosis present

## 2022-05-08 DIAGNOSIS — Z8042 Family history of malignant neoplasm of prostate: Secondary | ICD-10-CM | POA: Diagnosis not present

## 2022-05-08 DIAGNOSIS — Z9989 Dependence on other enabling machines and devices: Secondary | ICD-10-CM

## 2022-05-08 DIAGNOSIS — R7989 Other specified abnormal findings of blood chemistry: Secondary | ICD-10-CM | POA: Diagnosis present

## 2022-05-08 DIAGNOSIS — Z79899 Other long term (current) drug therapy: Secondary | ICD-10-CM | POA: Diagnosis not present

## 2022-05-08 LAB — BASIC METABOLIC PANEL
Anion gap: 10 (ref 5–15)
BUN: 13 mg/dL (ref 6–20)
CO2: 23 mmol/L (ref 22–32)
Calcium: 9.2 mg/dL (ref 8.9–10.3)
Chloride: 104 mmol/L (ref 98–111)
Creatinine, Ser: 1.18 mg/dL (ref 0.61–1.24)
GFR, Estimated: >60 mL/min (ref >60)
Glucose, Bld: 92 mg/dL (ref 70–99)
Potassium: 3.5 mmol/L (ref 3.5–5.1)
Sodium: 137 mmol/L (ref 135–145)

## 2022-05-08 LAB — MRSA NEXT GEN BY PCR, NASAL: MRSA by PCR Next Gen: NOT DETECTED

## 2022-05-08 LAB — LIPID PANEL
Cholesterol: 190 mg/dL (ref 0–200)
HDL: 31 mg/dL — ABNORMAL LOW (ref >40)
LDL Cholesterol: 142 mg/dL — ABNORMAL HIGH (ref 0–99)
Total CHOL/HDL Ratio: 6.1 RATIO
Triglycerides: 84 mg/dL (ref <150)
VLDL: 17 mg/dL (ref 0–40)

## 2022-05-08 LAB — TROPONIN I (HIGH SENSITIVITY)
Troponin I (High Sensitivity): 126 ng/L (ref <18)
Troponin I (High Sensitivity): 125 ng/L (ref <18)

## 2022-05-08 LAB — HEMOGLOBIN A1C
Hgb A1c MFr Bld: 5.3 % (ref 4.8–5.6)
Mean Plasma Glucose: 105.41 mg/dL

## 2022-05-08 LAB — HIV ANTIBODY (ROUTINE TESTING W REFLEX): HIV Screen 4th Generation wRfx: NONREACTIVE

## 2022-05-08 LAB — BRAIN NATRIURETIC PEPTIDE: B Natriuretic Peptide: 69.3 pg/mL (ref 0.0–100.0)

## 2022-05-08 MED ORDER — ORAL CARE MOUTH RINSE: 15.0000 mL | OROMUCOSAL | Status: DC | PRN

## 2022-05-08 MED ORDER — ACETAMINOPHEN 325 MG PO TABS
650.0000 mg | ORAL_TABLET | Freq: Once | ORAL | Status: AC
  Administered 2022-05-08: 650 mg via ORAL
  Filled 2022-05-08: qty 2

## 2022-05-08 MED ORDER — ONDANSETRON HCL 4 MG PO TABS: 4.0000 mg | ORAL_TABLET | Freq: Four times a day (QID) | ORAL | Status: DC | PRN

## 2022-05-08 MED ORDER — AMLODIPINE BESYLATE 10 MG PO TABS
10.0000 mg | ORAL_TABLET | Freq: Every day | ORAL | Status: DC
  Administered 2022-05-08 – 2022-05-10 (×3): 10 mg via ORAL
  Filled 2022-05-08 (×3): qty 1

## 2022-05-08 MED ORDER — ASPIRIN 81 MG PO CHEW
81.0000 mg | CHEWABLE_TABLET | Freq: Every day | ORAL | Status: DC
  Administered 2022-05-08 – 2022-05-11 (×4): 81 mg via ORAL
  Filled 2022-05-08 (×4): qty 1

## 2022-05-08 MED ORDER — ENOXAPARIN SODIUM 60 MG/0.6ML IJ SOSY
60.0000 mg | PREFILLED_SYRINGE | INTRAMUSCULAR | Status: DC
  Administered 2022-05-08 – 2022-05-11 (×4): 60 mg via SUBCUTANEOUS
  Filled 2022-05-08 (×4): qty 0.6

## 2022-05-08 MED ORDER — METOPROLOL SUCCINATE ER 25 MG PO TB24
25.0000 mg | ORAL_TABLET | Freq: Every day | ORAL | Status: DC
  Administered 2022-05-08 – 2022-05-11 (×4): 25 mg via ORAL
  Filled 2022-05-08 (×4): qty 1

## 2022-05-08 MED ORDER — CHLORHEXIDINE GLUCONATE CLOTH 2 % EX PADS
6.0000 | MEDICATED_PAD | Freq: Every day | CUTANEOUS | Status: DC
  Administered 2022-05-08 – 2022-05-10 (×3): 6 via TOPICAL

## 2022-05-08 MED ORDER — ACETAMINOPHEN 650 MG RE SUPP: 650.0000 mg | Freq: Four times a day (QID) | RECTAL | Status: DC | PRN

## 2022-05-08 MED ORDER — ATORVASTATIN CALCIUM 40 MG PO TABS
80.0000 mg | ORAL_TABLET | Freq: Every day | ORAL | Status: DC
Start: 2022-05-08 — End: 2022-05-11
  Administered 2022-05-08 – 2022-05-11 (×4): 80 mg via ORAL
  Filled 2022-05-08 (×4): qty 2

## 2022-05-08 MED ORDER — METOPROLOL SUCCINATE ER 25 MG PO TB24: 25.0000 mg | ORAL_TABLET | Freq: Every day | ORAL | Status: DC

## 2022-05-08 MED ORDER — SODIUM CHLORIDE (PF) 0.9 % IJ SOLN
INTRAMUSCULAR | Status: AC
  Filled 2022-05-08: qty 50

## 2022-05-08 MED ORDER — ACETAMINOPHEN 325 MG PO TABS
650.0000 mg | ORAL_TABLET | Freq: Four times a day (QID) | ORAL | Status: DC | PRN
  Administered 2022-05-08 – 2022-05-09 (×3): 650 mg via ORAL
  Filled 2022-05-08 (×3): qty 2

## 2022-05-08 MED ORDER — MELATONIN 3 MG PO TABS
3.0000 mg | ORAL_TABLET | Freq: Every day | ORAL | Status: DC
  Administered 2022-05-08 – 2022-05-10 (×3): 3 mg via ORAL
  Filled 2022-05-08 (×3): qty 1

## 2022-05-08 MED ORDER — ONDANSETRON HCL 4 MG/2ML IJ SOLN: 4.0000 mg | Freq: Four times a day (QID) | INTRAMUSCULAR | Status: DC | PRN

## 2022-05-08 MED ORDER — POLYETHYLENE GLYCOL 3350 17 G PO PACK: 17.0000 g | PACK | Freq: Every day | ORAL | Status: DC | PRN

## 2022-05-08 MED ORDER — LISINOPRIL 10 MG PO TABS: 20.0000 mg | ORAL_TABLET | Freq: Every day | ORAL | Status: DC

## 2022-05-08 MED ORDER — LISINOPRIL 10 MG PO TABS
20.0000 mg | ORAL_TABLET | Freq: Every day | ORAL | Status: DC
  Administered 2022-05-08 – 2022-05-11 (×4): 20 mg via ORAL
  Filled 2022-05-08 (×4): qty 2

## 2022-05-08 NOTE — Assessment & Plan Note (Addendum)
   Patient presenting with significant chest pain in the setting of extremely uncontrolled blood pressures  Patient reports compliance with his antihypertensive regimen  Nitroglycerin infusion was initiated by ER provider, currently being titrated in the stepdown unit  Resuming home antihypertensive therapy followed by attempting to wean off nitroglycerin infusion  Suspect markedly elevated blood pressure is the cause of the patient's elevated troponin and chest discomfort

## 2022-05-08 NOTE — Progress Notes (Signed)
  Transition of Care [facility]) Screening Note   Patient Details  Name: [name] Date of Birth: 08-21-1981   Transition of Care [facility]) CM/SW Contact:    Lanier Clam, RN Phone Number: 05/08/2022, 10:01 AM    Transition of Care Department [facility]) has reviewed patient and no TOC needs have been identified at this time. We will continue to monitor patient advancement through interdisciplinary progression rounds. If new patient transition needs arise, please place a TOC consult.

## 2022-05-08 NOTE — H&P (Signed)
History and Physical    Patient: Shawn James MRN: [id number] DOA: 05/07/2022  Date of Service: the patient was seen and examined on 05/08/2022  Patient coming from: Home via [facility] ED  Chief Complaint:  Chief Complaint  Patient presents with   Chest Pain   Hypertension    HPI:   41 year old male with past medical history of hypertension, obesity, migraine headaches and sleep apnea (on CPAP 6-18cm H20) who presents to [facility] emergency department with complaints of chest pain.  Patient explains that he was driving his car the evening of 6/19 when he began to have chest pain.  Patient describes the chest pain as midsternal in location, sharp in quality and severe in intensity.  After short while the sharp sensation became more pressure-like in quality.  Patient denies any cough, fever, shortness of breath, leg swelling, paroxysmal atrial dyspnea or pillow orthopnea.  Patient reports that he is compliant with his home antihypertensive regimen.  Patient denies any alcohol drug use or smoking.  Due to persisting symptoms patient eventually presented to a local urgent care clinic where he was given a dose of aspirin and instructed to go to the emergency department.  Upon evaluation at [facility] emergency department, patient was chest pain-free.  Patient was found to be exhibiting markedly elevated blood pressure however with initial blood pressure of 200/135.  Serial cardiac enzymes were performed with initial troponin found to be 77 followed by second troponin found to be 116.  ER provider felt that this was secondary to hypertensive emergency.  Patient was initiated on a nitroglycerin infusion.  The hospitalist group was called and patient was accepted for transfer to [facility] hospital for continued management.   Review of Systems: Review of Systems  Cardiovascular:  Positive for chest pain.     Past Medical History:  Diagnosis Date   Bursitis    Chicken pox    Hypertension    Migraine     Obesity     History reviewed. No pertinent surgical history.  Social History:  reports that he quit smoking about 6 years ago. His smoking use included cigars. He has never used smokeless tobacco. He reports current alcohol use of about 1.0 standard drink of alcohol per week. He reports that he does not use drugs.  No Known Allergies  Family History  Problem Relation Age of Onset   Heart disease Mother    Diabetes Mother    Heart disease Father    Diabetes Father    Cancer Father        prostate cancer   Bell's palsy Brother     Prior to Admission medications   Medication Sig Start Date End Date Taking? Authorizing Provider  amLODipine (NORVASC) 10 MG tablet Take 1 tablet (10 mg total) by mouth at bedtime. 11/02/19   Nche, Charlene Brooke, NP  lisinopril (ZESTRIL) 20 MG tablet Take 1 tablet (20 mg total) by mouth daily. 09/05/20   Nche, Charlene Brooke, NP  metoprolol succinate (TOPROL-XL) 25 MG 24 hr tablet Take 1 tablet (25 mg total) by mouth daily. 09/05/20   Nche, Charlene Brooke, NP  tadalafil (CIALIS) 5 MG tablet Take 1 tablet (5 mg total) by mouth daily as needed for erectile dysfunction. 05/05/20   Nche, Charlene Brooke, NP  fluticasone (FLONASE) 50 MCG/ACT nasal spray Place 2 sprays into both nostrils daily. Patient not taking: Reported on 02/24/2020 03/28/18 06/05/20  Margot Ables, NP    Physical Exam:  Vitals:   05/08/22 0400  05/08/22 0445 05/08/22 0500 05/08/22 0515  BP: (!) 165/115 (!) 157/86 (!) 169/110 (!) 182/107  Pulse: 88 88 90 (!) 106  Resp: 18 11 10 17   Temp: 98.4 F (36.9 C)     TempSrc: Oral     SpO2: 98% 99% 99% 98%  Weight:      Height:        Constitutional: Awake alert and oriented x3, no associated distress.  Patient is obese. Skin: no rashes, no lesions, good skin turgor noted. Eyes: Pupils are equally reactive to light.  No evidence of scleral icterus or conjunctival pallor.  ENMT: Moist mucous membranes noted.  Posterior pharynx clear of  any exudate or lesions.   Neck: normal, supple, no masses, no thyromegaly.  No evidence of jugular venous distension.   Respiratory: clear to auscultation bilaterally, no wheezing, no crackles. Normal respiratory effort. No accessory muscle use.  Cardiovascular: Regular rate and rhythm, no murmurs / rubs / gallops. No extremity edema. 2+ pedal pulses. No carotid bruits.  Chest:   Nontender without crepitus or deformity.   Back:   Nontender without crepitus or deformity. Abdomen: Abdomen is soft and nontender.  No evidence of intra-abdominal masses.  Positive bowel sounds noted in all quadrants.   Musculoskeletal: No joint deformity upper and lower extremities. Good ROM, no contractures. Normal muscle tone.  Neurologic: CN 2-12 grossly intact. Sensation intact.  Patient moving all 4 extremities spontaneously.  Patient is following all commands.  Patient is responsive to verbal stimuli.   Psychiatric: Patient exhibits normal mood with appropriate affect.  Patient seems to possess insight as to their current situation.    Data Reviewed:  I have personally reviewed and interpreted labs, imaging.  Significant findings are:  CT angiogram of the chest abdomen and pelvis performed revealing no evidence of aortic dissection with evidence of coronary calcifications. Chest x-ray personally reviewed revealing no evidence of active cardiopulmonary disease. D-dimer found to be 0.31 2 sets of troponins obtained.  First troponin was 77 and second troponin was 116   EKG: Personally reviewed.  Rhythm is Normal sinus rhythm with heart rate of 82 bpm.  Mild ST segment elevation in the anterior leads with upsloping pattern that appears to be early repolarization.  T wave inversions in the inferior leads as well as leads V5 and V6.  Assessment and Plan: * Hypertensive crisis Patient presenting with significant chest pain in the setting of extremely uncontrolled blood pressures Patient reports compliance with  his antihypertensive regimen Nitroglycerin infusion was initiated by ER provider, currently being titrated in the stepdown unit Resuming home antihypertensive therapy followed by attempting to wean off nitroglycerin infusion Suspect markedly elevated blood pressure is the cause of the patient's elevated troponin and chest discomfort  Chest pain D-dimer unremarkable Troponin slightly elevated with upward trajectory although this may be secondary to markedly elevated blood pressure Continuing to cycle cardiac enzymes We will see if chest discomfort persists with normotension  We will ask cardiology for their input on whether a invasive or noninvasive ischemic assessment is indicated Continue to monitor patient on telemetry  Elevated troponin level not due myocardial infarction Elevated troponins likely not secondary to ACS and more likely secondary to demand ischemia from markedly elevated blood pressures Continuing to cycle cardiac enzymes to peak Currently chest pain-free Monitoring patient on telemetry  Class 2 severe obesity due to excess calories with serious comorbidity and body mass index (BMI) of 39.0 to 39.9 in adult [facility]) Once patient is clinically improving, will  counsel patient on caloric restriction and regular physical activity.    OSA on CPAP Continuing home regimen of CPAP nightly        Code Status:  Full code  code status decision has been confirmed with: patient Family Communication: deferred   Consults: Cardiology for chest pain  Severity of Illness:  The appropriate patient status for this patient is OBSERVATION. Observation status is judged to be reasonable and necessary in order to provide the required intensity of service to ensure the patient's safety. The patient's presenting symptoms, physical exam findings, and initial radiographic and laboratory data in the context of their medical condition is felt to place them at decreased risk for further clinical  deterioration. Furthermore, it is anticipated that the patient will be medically stable for discharge from the hospital within 2 midnights of admission.   Author:  Marinda Elk MD  05/08/2022 5:33 AM

## 2022-05-08 NOTE — Assessment & Plan Note (Signed)
.   Continuing home regimen of CPAP nightly  

## 2022-05-08 NOTE — Plan of Care (Signed)
Discussed with patient plan of care, pain management, nitro gtt and admission questions with some teach back displayed  Problem: Education: Goal: Knowledge of General Education information will improve Description: Including pain rating scale, medication(s)/side effects and non-pharmacologic comfort measures Outcome: Progressing

## 2022-05-08 NOTE — Progress Notes (Signed)
Patient arrived from [facility], paged admitting.

## 2022-05-08 NOTE — Plan of Care (Signed)
Patient admitted today with hypertensive crisis On nitro drip Home medications resumed Norvasc lisinopril and metoprolol Echo pending Mildly elevated troponins Elevated LDL at 142 Followed by cardiology

## 2022-05-08 NOTE — Progress Notes (Addendum)
Transferring facility: [facility] Requesting provider: Dr. Renaye Rakers (EDP at [facility]) Reason for transfer: admission for further evaluation and management of hypertensive crisis  41 year old male with medical history notable for obesity, hypertension,, who presented to [facility] ED on 05/07/2022 complaining of chest pain that started at rest while driving her earlier today.  Described the chest pain is substernal burning sensation, nonexertional.  Remain constant until hypertensive blood pressures begin to improve in [facility] ED, as further detailed below.  Subsequently chest pain-free throughout the remainder of his ED course [facility].  Patient reportedly knowledges a history of hypertension, which it appears that he is prescribed metoprolol succinate, lisinopril, and amlodipine.  However, patient reports that he is actively taking only 1 of these 3 blood pressure medications.  No recent trauma.  Vital signs in the ED were notable for the following: Initial blood pressure 200/135; heart rate initially in the 80s, although it is increased in the low 100s following initiation of nitroglycerin drip; afebrile; oxygen saturation is 95 to 99% on room air.  Labs were notable for nonelevated D-dimer.  High-sensitivity troponin I initially noted to be 77, with repeat value trending up to 116.  Imaging notable for EKG reportedly suggestive of LVH, without any evidence of T wave or ST changes.  Chest x-ray showed no evidence of acute cardiopulmonary process.  In the setting of the patient's elevated blood pressure and tachycardia, EDP also ordered CTA chest, abdomen, pelvis with dissection study to further evaluate.  However, the patient was transported before the ability to obtain the study.  Medications administered prior to transfer included the following: IV hydralazine followed by initiation of Cardene drip, which was discontinued shortly after initiation fever of nitroglycerin drip  in order to make eligible admission to the hospitalist service at both [facility] and [facility], as Cardene drips at [facility] get admitted to the ICU.  Subsequently, I accepted this patient for transfer for observation admission to a s[facility] bed at either [facility] or [facility] (first available) for further work-up and management of hypertensive crisis.        Check [email or web address] for on-call coverage.   Nursing staff, Please call [facility] Admits & Consults System-Wide number on Amion as soon as patient's arrival, so appropriate admitting provider can evaluate the pt.     Newton Pigg, DO Hospitalist

## 2022-05-08 NOTE — Consult Note (Addendum)
Cardiology Consultation:   Patient ID: Shawn James MRN: [id number]; DOB: [birth year]  Admit date: 05/07/2022 Date of Consult: 05/08/2022  PCP:  Flossie Buffy, NP   [facility] Providers Cardiologist:  None     Patient Profile:   Shawn James is a 41 y.o. male with a hx of HTN, obesity, migraine headaches, OSA on CPAP who is being seen 05/08/2022 for the evaluation of chest pain at the request of Dr. Rodena Piety.  History of Present Illness:   Shawn James is a 41 year old male with above medical history. Per chart review, patient has not ben seen by a cardiologist in the past and does not appear to have any past cardiac history aside from OSA.   Patient presented to the [facility] emergency department on 6/19 complaining of chest pain that started 2 hours PTA. He was found to be extremly hypertensive at triage with systolic BP> 342. Labs in the ED showed Na 137, K 3.3, creatinine 1.35, BUN 15, eGFR >60. WBC 9.9, hemoglobin 16.1, platelets 238. BNP was normal at 69.3. hsTn 77>>116>>126. D-Dimer WNL. CXR showed no active cardiopulmonary disease. CT angio for dissection showed no acute aortic syndrome, atheromatous calcification of left-sided coronary arteries. EKG showed sinus rhythm, inverted T waves in the inferolateral leads, borderline ST elevation in the anterior leads. Repeat EKG after some improvement in BP showed sinus rhythm, inverted T waves in the lateral leads, LVH.   Per chart review, EKG from 02/2020 also had T wave inversions in the inferolateral leads, borderline ST elevation in anterior leads.   Patient was started on IV nicardipine, IV nitro in the ED. Has not had chest pain since. BP improving to 876O-115B systolic.    On interview, patient reports that he was driving his car when he had an acute onset of sharp chest pain located in the middle of his chest. Pain went from being sharp to dull, felt like pressure. Pain did not radiate. Not associated with diaphoresis, n/v, sob. Did  have a headache. Pain has improved since coming to the hospital and he is currently chest pain free. Denies having any recent chest pain or SOB on exertion. Denies having any past cardiac history. Denies drug, tobacco use. Socially drinks alcohol.    Past Medical History:  Diagnosis Date   Bursitis    Chicken pox    Hypertension    Migraine    Obesity     History reviewed. No pertinent surgical history.   Home Medications:  Prior to Admission medications   Medication Sig Start Date End Date Taking? Authorizing Provider  diphenhydrAMINE (BENADRYL) 25 mg capsule Take 25 mg by mouth every 6 (six) hours as needed for allergies.   Yes [provider]  diphenhydramine-acetaminophen (TYLENOL PM) 25-500 MG TABS tablet Take 2 tablets by mouth at bedtime as needed (sleep).   Yes [provider]  diphenhydrAMINE-PE-APAP (THERAFLU EXPRESSMAX) 12.5-5-325 MG/15ML LIQD Take 15 mLs by mouth every 12 (twelve) hours as needed (congestion).   Yes [provider]  guaiFENesin (MUCINEX) 600 MG 12 hr tablet Take 600 mg by mouth 2 (two) times daily as needed for to loosen phlegm.   Yes [provider]  lisinopril (ZESTRIL) 20 MG tablet Take 1 tablet (20 mg total) by mouth daily. 09/05/20  Yes Nche, Charlene Brooke, NP  Oxymetazoline HCl-Menthol (AFRIN MENTHOL SPRAY) 0.05 % SOLN Place 1 spray into both nostrils every 12 (twelve) hours as needed (congestion).   Yes [provider]  amLODipine (NORVASC) 10  MG tablet Take 1 tablet (10 mg total) by mouth at bedtime. Patient not taking: Reported on 05/08/2022 11/02/19   Nche, Charlene Brooke, NP  metoprolol succinate (TOPROL-XL) 25 MG 24 hr tablet Take 1 tablet (25 mg total) by mouth daily. Patient not taking: Reported on 05/08/2022 09/05/20   Nche, Charlene Brooke, NP  fluticasone [facility]) 50 MCG/ACT nasal spray Place 2 sprays into both nostrils daily. Patient not taking: Reported on 02/24/2020 03/28/18 06/05/20  Margot Ables, NP    Inpatient Medications: Scheduled Meds:  amLODipine  10 mg Oral QHS   aspirin  81 mg Oral Daily   Chlorhexidine Gluconate Cloth  6 each Topical Daily   enoxaparin (LOVENOX) injection  60 mg Subcutaneous Q24H   lisinopril  20 mg Oral Daily   metoprolol succinate  25 mg Oral Daily   sodium chloride (PF)       Continuous Infusions:  nitroGLYCERIN 15 mcg/min (05/08/22 0659)   PRN Meds: acetaminophen **OR** acetaminophen, ondansetron **OR** ondansetron (ZOFRAN) IV, mouth rinse, polyethylene glycol, sodium chloride (PF)  Allergies:   No Known Allergies  Social History:   Social History   Socioeconomic History   Marital status: Single    Spouse name: Not on file   Number of children: 1   Years of education: 15   Highest education level: Not on file  Occupational History   Occupation: Presenter, broadcasting   Occupation: Psychologist, counselling  Tobacco Use   Smoking status: Former    Types: Cigars    Quit date: 11/20/2015    Years since quitting: 6.4   Smokeless tobacco: Never  Vaping Use   Vaping Use: Never used  Substance and Sexual Activity   Alcohol use: Yes    Alcohol/week: 1.0 standard drink of alcohol    Types: 1 Cans of beer per week    Comment: twice a week   Drug use: Never   Sexual activity: Not on file  Other Topics Concern   Not on file  Social History Narrative   Born and raised in [place], [place]. Currently lives in an apartment with by himself. No pets. Fun: Movies, eating out, travel   Denies religious beliefs that would effect health care.    Social Determinants of Health   Financial Resource Strain: Not on file  Food Insecurity: Not on file  Transportation Needs: Not on file  Physical Activity: Not on file  Stress: Not on file  Social Connections: Not on file  Intimate Partner Violence: Not on file    Family History:    Family History  Problem Relation Age of Onset   Heart disease Mother    Diabetes Mother    Heart disease Father     Diabetes Father    Cancer Father        prostate cancer   Bell's palsy Brother      ROS:  Please see the history of present illness.   All other ROS reviewed and negative.     Physical Exam/Data:   Vitals:   05/08/22 0615 05/08/22 0630 05/08/22 0645 05/08/22 0800  BP: (!) 179/110 (!) 193/124 (!) 209/126   Pulse: 94 94 81   Resp: 20 (!) 21 17   Temp:    98 F (36.7 C)  TempSrc:    Oral  SpO2: 98% 97% 94%   Weight:      Height:        Intake/Output Summary (Last 24 hours) at 05/08/2022 0908 Last data filed at 05/08/2022  8325 Gross per 24 hour  Intake 67.13 ml  Output 400 ml  Net -332.87 ml      05/08/2022   12:45 AM 05/07/2022    7:28 PM 09/01/2020    8:59 AM  Last 3 Weights  Weight (lbs) 285 lb 11.5 oz 290 lb 284 lb 3.2 oz  Weight (kg) 129.6 kg 131.543 kg 128.912 kg     Body mass index is 39.85 kg/m.  General:  Pleasant middle aged gentleman sitting comfortably in the bed in no acute distress  HEENT: normal Neck: no JVD Vascular: Radial pulses 2+ bilaterally Cardiac:  normal S1, S2; RRR; no murmur  Lungs:  clear to auscultation bilaterally, no wheezing, rhonchi or rales  Abd: soft, nontender, no hepatomegaly  Ext: no edema Musculoskeletal:  No deformities, BUE and BLE strength normal and equal Skin: warm and dry  Neuro:  CNs 2-12 intact, no focal abnormalities noted Psych:  Normal affect   EKG:  The EKG was personally reviewed and demonstrates:  EKG from 6/20 showed sinus rhythm, minimal criteria for LVH, inverted T waves in leads I. II, aVL, V4-V6.  Telemetry:  Telemetry was personally reviewed and demonstrates:  sinus rhythm   Relevant CV Studies: Echo- pending   Laboratory Data:  High Sensitivity Troponin:   Recent Labs  Lab 05/07/22 1948 05/07/22 2128 05/08/22 0546  TROPONINIHS 77* 116* 126*     Chemistry Recent Labs  Lab 05/07/22 1948  NA 137  K 3.3*  CL 103  CO2 26  GLUCOSE 100*  BUN 15  CREATININE 1.35*  CALCIUM 8.9  GFRNONAA >60   ANIONGAP 8    No results for input(s): "PROT", "ALBUMIN", "AST", "ALT", "ALKPHOS", "BILITOT" in the last 168 hours. Lipids  Recent Labs  Lab 05/08/22 0546  CHOL 190  TRIG 84  HDL 31*  LDLCALC 142*  CHOLHDL 6.1    Hematology Recent Labs  Lab 05/07/22 1948  WBC 9.9  RBC 6.06*  HGB 16.1  HCT 48.7  MCV 80.4  MCH 26.6  MCHC 33.1  RDW 13.2  PLT 238   Thyroid No results for input(s): "TSH", "FREET4" in the last 168 hours.  BNP Recent Labs  Lab 05/08/22 0546  BNP 69.3    DDimer  Recent Labs  Lab 05/07/22 2128  DDIMER 0.31     Radiology/Studies:  CT Angio Chest/Abd/Pel for Dissection W and/or Wo Contrast  Result Date: 05/08/2022 CLINICAL DATA:  Chest pain on nitroglycerin drip, evaluate for acute aortic syndrome EXAM: CT ANGIOGRAPHY CHEST, ABDOMEN AND PELVIS TECHNIQUE: Non-contrast CT of the chest was initially obtained. Multidetector CT imaging through the chest, abdomen and pelvis was performed using the standard protocol during bolus administration of intravenous contrast. Multiplanar reconstructed images and MIPs were obtained and reviewed to evaluate the vascular anatomy. RADIATION DOSE REDUCTION: This exam was performed according to the departmental dose-optimization program which includes automated exposure control, adjustment of the mA and/or kV according to patient size and/or use of iterative reconstruction technique. CONTRAST:  133m OMNIPAQUE IOHEXOL 350 MG/ML SOLN COMPARISON:  None Available. FINDINGS: CTA CHEST FINDINGS Cardiovascular: Preferential opacification of the thoracic aorta. No evidence of thoracic aortic aneurysm or dissection. Heart size is normal. No pericardial effusion. Atheromatous calcification of left-sided coronary arteries. Mediastinum/Nodes: Negative for adenopathy or hematoma Lungs/Pleura: Lungs are clear. No pleural effusion or pneumothorax. Musculoskeletal: Thoracic spondylosis. Review of the MIP images confirms the above findings. CTA  ABDOMEN AND PELVIS FINDINGS VASCULAR Aorta: Normal caliber aorta without aneurysm, dissection, vasculitis or significant  stenosis. Celiac: Unremarkable SMA: Unremarkable Renals: Single bilateral renal arteries are smoothly contoured and widely patent IMA: Unremarkable Inflow: Unremarkable Veins: Negative in the arterial phase Review of the MIP images confirms the above findings. NON-VASCULAR Hepatobiliary: No focal liver abnormality.No evidence of biliary obstruction or stone. Pancreas: Unremarkable. Spleen: Unremarkable. Adrenals/Urinary Tract: Negative adrenals. No hydronephrosis or stone. Unremarkable bladder. Stomach/Bowel:  No obstruction. No visible inflammation. Lymphatic: No mass or adenopathy. Reproductive:No pathologic findings. Other: No ascites or pneumoperitoneum. Musculoskeletal: No acute abnormalities. Review of the MIP images confirms the above findings. IMPRESSION: 1. Negative for acute aortic syndrome. 2. Coronary calcification. Electronically Signed   By: Jorje Guild M.D.   On: 05/08/2022 04:23   DG Chest 2 View  Result Date: 05/07/2022 CLINICAL DATA:  Chest pain starting 2 hours prior to admission. EXAM: CHEST - 2 VIEW COMPARISON:  02/24/2020 FINDINGS: The heart size and mediastinal contours are within normal limits. Both lungs are clear. The visualized skeletal structures are unremarkable. IMPRESSION: No active cardiopulmonary disease. Electronically Signed   By: Lucienne Capers M.D.   On: 05/07/2022 19:47     Assessment and Plan:   Hypertensive Crisis  - Continue home amlodipine 10 mg daily, lisinopril 20 mg daily, metoprolol 25 mg daily - On nitro drip-- being titrated down  - BP continues to be high, follow with the resumption of home medications   Elevated troponin  Chest Pain  - hsTn 77>>116>>126  - Troponin did trend upward from 77 up to 116, but appear to be flattening out. Continue to trend  - Likely that this represent troponin leakage in the setting of  hypertensive crisis, chest pain is resolving with BP improvement - However, patient did have some atheromatous calcification of the left-sided coronary arteries on chest CTA. Does have risk factors for CAD including HLD, HTN, obesity - Echocardiogram pending - Pending echocardiogram-- patient may [facility] from [facility] given presence of coronary calcification on chest CTA, risk factors for CAD. Could also consider coronary CT, patient does have mildly elevated troponin but he has a flat trend. Will discuss with MD and follow echocardiogram results.   HLD  - LDL 142, HDL 31, triglycerides 84 this admission  - Start Lipitor 80 mg daily - will need LFTs and lipid panel in 2-3 months   Risk Assessment/Risk Scores:   TIMI Risk Score for Unstable Angina or Non-ST Elevation MI:   The patient's TIMI risk score is 2, which indicates a 8% risk of all cause mortality, new or recurrent myocardial infarction or need for urgent revascularization in the next 14 days.{         For questions or updates, please contact [place] Please consult [email or web address] for contact info under    Signed, Margie Billet, PA-C  05/08/2022 9:08 AM  Personally seen and examined. Agree with above.  41 year old male with dull pressure-like chest discomfort in the setting of hypertensive urgency.  Troponin mildly elevated to 126.  BNP was normal.  Creatinine 1.35.  Blood pressures were greater than 280 systolic.  T wave inversion fairly diffuse noted on EKG.  This has been present since 2021.  Likely represents hypertensive heart disease.  A CT scan showed no evidence of aortic dissection.  There was evidence of coronary calcification however.  I agree that his mild troponin elevation is likely a type II myocardial infarction in the setting of hypertensive urgency.  My suspicion is that he does not have flow-limiting coronary artery disease present although coronary artery calcification was  noted on CT.  If after  treatment of hypertension, chest discomfort is noted especially with exertion, we would have low threshold for left heart catheterization to define coronary anatomy.  Agree with atorvastatin 80 mg.  Aspirin 81 mg also suitable.  Consider utilizing irbesartan instead of lisinopril and labetalol for hypertension control.  Agree with amlodipine.  Candee Furbish, MD

## 2022-05-08 NOTE — Assessment & Plan Note (Addendum)
   Elevated troponins likely not secondary to ACS and more likely secondary to demand ischemia from markedly elevated blood pressures  Continuing to cycle cardiac enzymes to peak  Currently chest pain-free  Monitoring patient on telemetry  Remainder of A/P as above

## 2022-05-08 NOTE — Assessment & Plan Note (Signed)
.   Once patient is clinically improving, will counsel patient on caloric restriction and regular physical activity.   

## 2022-05-08 NOTE — TOC Initial Note (Addendum)
Transition of Care [facility]) - Initial/Assessment Note    Patient Details  Name: Shawn James MRN: [id number] Date of Birth: [birth year]  Transition of Care [facility]) CM/SW Contact:    Lanier Clam, RN Phone Number: 05/08/2022, 11:34 AM  Clinical Narrative:Patient has health insurance-emailed to admitting. Has pharmacy,transport home, has CPAP. He will set up pcp appt w/[place] [facility] on his own.On 02 monitor.                   Expected Discharge Plan: Home/Self Care Barriers to Discharge: Continued Medical Work up   Patient Goals and CMS Choice Patient states their goals for this hospitalization and ongoing recovery are:: Home CMS Medicare.gov Compare Post Acute Care list provided to:: Patient Choice offered to / list presented to : Patient  Expected Discharge Plan and Services Expected Discharge Plan: Home/Self Care   Discharge Planning Services: CM Consult   Living arrangements for the past 2 months: Single Family Home                                      Prior Living Arrangements/Services Living arrangements for the past 2 months: Single Family Home Lives with:: Relatives Patient language and need for interpreter reviewed:: Yes        Need for Family Participation in Patient Care: Yes (Comment) Care giver support system in place?: Yes (comment) Current home services: DME (CPAP) Criminal Activity/Legal Involvement Pertinent to Current Situation/Hospitalization: No - Comment as needed  Activities of Daily Living Home Assistive Devices/Equipment: Contact lenses ADL Screening (condition at time of admission) Patient's cognitive ability adequate to safely complete daily activities?: Yes Is the patient deaf or have difficulty hearing?: No Does the patient have difficulty seeing, even when wearing glasses/contacts?: Yes Does the patient have difficulty concentrating, remembering, or making decisions?: No Patient able to express need for assistance with ADLs?:  Yes Does the patient have difficulty dressing or bathing?: No Independently performs ADLs?: Yes (appropriate for developmental age) Does the patient have difficulty walking or climbing stairs?: No Weakness of Legs: None Weakness of Arms/Hands: None  Permission Sought/Granted Permission sought to share information with : Case Manager Permission granted to share information with : Yes, Verbal Permission Granted  Share Information with NAME: Case Manager           Emotional Assessment Appearance:: Appears stated age Attitude/Demeanor/Rapport: Gracious Affect (typically observed): Accepting Orientation: : Oriented to Self, Oriented to Place, Oriented to  Time, Oriented to Situation Alcohol / Substance Use: Not Applicable Psych Involvement: No (comment)  Admission diagnosis:  Hypertensive crisis [I16.9] Hypertensive emergency [I16.1] Patient Active Problem List   Diagnosis Date Noted   Chest pain 05/08/2022   Elevated troponin level not due myocardial infarction 05/08/2022   Hypertensive crisis 05/07/2022   OSA on CPAP 09/08/2020   Class 2 severe obesity due to excess calories with serious comorbidity and body mass index (BMI) of 39.0 to 39.9 in adult [facility]) 11/02/2019   Erectile dysfunction 03/14/2018   Bilateral shoulder bursitis 12/24/2014   Essential hypertension 12/09/2014   PCP:  Anne Ng, NP Pharmacy:   [facility] [address] [place], [place] - [address] [facility] [address] [facility] [address] [place]. [place] [address] Phone: [phone] Fax: [phone]     Social Determinants of Health (SDOH) Interventions    Readmission Risk Interventions     No  data to display           

## 2022-05-08 NOTE — Assessment & Plan Note (Addendum)
   D-dimer unremarkable  Troponin slightly elevated with upward trajectory although this may be secondary to markedly elevated blood pressure  Continuing to cycle cardiac enzymes  Daily aspirin  Obtain HgbA1C and lipid panel  We will see if chest discomfort persists with normotension   We will ask cardiology for their input on whether a invasive or noninvasive ischemic assessment is indicated  Continue to monitor patient on telemetry

## 2022-05-09 ENCOUNTER — Inpatient Hospital Stay (HOSPITAL_COMMUNITY): Payer: BC Managed Care – PPO

## 2022-05-09 DIAGNOSIS — R079 Chest pain, unspecified: Secondary | ICD-10-CM

## 2022-05-09 LAB — RAPID URINE DRUG SCREEN, HOSP PERFORMED
Amphetamines: NOT DETECTED
Barbiturates: NOT DETECTED
Benzodiazepines: NOT DETECTED
Cocaine: NOT DETECTED
Opiates: NOT DETECTED
Tetrahydrocannabinol: NOT DETECTED

## 2022-05-09 LAB — CBC WITH DIFFERENTIAL/PLATELET
Abs Immature Granulocytes: 0.02 10*3/uL (ref 0.00–0.07)
Basophils Absolute: 0 10*3/uL (ref 0.0–0.1)
Basophils Relative: 0 %
Eosinophils Absolute: 0.1 10*3/uL (ref 0.0–0.5)
Eosinophils Relative: 1 %
HCT: 48.6 % (ref 39.0–52.0)
Hemoglobin: 15.7 g/dL (ref 13.0–17.0)
Immature Granulocytes: 0 %
Lymphocytes Relative: 20 %
Lymphs Abs: 1.6 10*3/uL (ref 0.7–4.0)
MCH: 26.7 pg (ref 26.0–34.0)
MCHC: 32.3 g/dL (ref 30.0–36.0)
MCV: 82.8 fL (ref 80.0–100.0)
Monocytes Absolute: 0.6 10*3/uL (ref 0.1–1.0)
Monocytes Relative: 7 %
Neutro Abs: 5.7 10*3/uL (ref 1.7–7.7)
Neutrophils Relative %: 72 %
Platelets: 205 10*3/uL (ref 150–400)
RBC: 5.87 MIL/uL — ABNORMAL HIGH (ref 4.22–5.81)
RDW: 13.4 % (ref 11.5–15.5)
WBC: 7.9 10*3/uL (ref 4.0–10.5)
nRBC: 0 % (ref 0.0–0.2)

## 2022-05-09 LAB — COMPREHENSIVE METABOLIC PANEL
ALT: 33 U/L (ref 0–44)
AST: 19 U/L (ref 15–41)
Albumin: 3.7 g/dL (ref 3.5–5.0)
Alkaline Phosphatase: 46 U/L (ref 38–126)
Anion gap: 9 (ref 5–15)
BUN: 16 mg/dL (ref 6–20)
CO2: 25 mmol/L (ref 22–32)
Calcium: 9.1 mg/dL (ref 8.9–10.3)
Chloride: 104 mmol/L (ref 98–111)
Creatinine, Ser: 1.2 mg/dL (ref 0.61–1.24)
GFR, Estimated: >60 mL/min (ref >60)
Glucose, Bld: 96 mg/dL (ref 70–99)
Potassium: 3.5 mmol/L (ref 3.5–5.1)
Sodium: 138 mmol/L (ref 135–145)
Total Bilirubin: 1 mg/dL (ref 0.3–1.2)
Total Protein: 7.1 g/dL (ref 6.5–8.1)

## 2022-05-09 LAB — ECHOCARDIOGRAM COMPLETE
Area-P 1/2: 2.48 cm2
Height: 71 in
S' Lateral: 2.6 cm
Weight: 4571.46 oz

## 2022-05-09 LAB — URINALYSIS, ROUTINE W REFLEX MICROSCOPIC
Bacteria, UA: NONE SEEN
Bilirubin Urine: NEGATIVE
Glucose, UA: NEGATIVE mg/dL
Hgb urine dipstick: NEGATIVE
Ketones, ur: NEGATIVE mg/dL
Leukocytes,Ua: NEGATIVE
Nitrite: NEGATIVE
Protein, ur: 30 mg/dL — AB
Specific Gravity, Urine: 1.02 (ref 1.005–1.030)
pH: 7 (ref 5.0–8.0)

## 2022-05-09 LAB — MAGNESIUM: Magnesium: 2.1 mg/dL (ref 1.7–2.4)

## 2022-05-09 MED ORDER — METOPROLOL SUCCINATE ER 25 MG PO TB24
12.5000 mg | ORAL_TABLET | Freq: Once | ORAL | Status: AC
  Administered 2022-05-09: 12.5 mg via ORAL
  Filled 2022-05-09: qty 1

## 2022-05-09 MED ORDER — ISOSORBIDE MONONITRATE ER 30 MG PO TB24
15.0000 mg | ORAL_TABLET | Freq: Every day | ORAL | Status: DC
  Administered 2022-05-09 – 2022-05-11 (×3): 15 mg via ORAL
  Filled 2022-05-09 (×3): qty 1

## 2022-05-09 MED ORDER — HYDRALAZINE HCL 25 MG PO TABS
25.0000 mg | ORAL_TABLET | Freq: Three times a day (TID) | ORAL | Status: DC
  Administered 2022-05-09 – 2022-05-10 (×2): 25 mg via ORAL
  Filled 2022-05-09 (×2): qty 1

## 2022-05-09 MED ORDER — HYDRALAZINE HCL 20 MG/ML IJ SOLN
10.0000 mg | Freq: Four times a day (QID) | INTRAMUSCULAR | Status: DC | PRN
  Administered 2022-05-09 – 2022-05-10 (×3): 10 mg via INTRAVENOUS
  Filled 2022-05-09 (×3): qty 1

## 2022-05-09 NOTE — Progress Notes (Addendum)
Progress Note  [name] Date of Encounter: 05/09/2022  [facility] Cardiologist: Shawn Furbish, MD   Subjective   Patient denies any chest pain, sob. Has not been out of bed to walk in the halls. Instructed him to be aware of and report any chest discomfort he has on exertion  Inpatient Medications    Scheduled Meds:  amLODipine  10 mg Oral QHS   aspirin  81 mg Oral Daily   atorvastatin  80 mg Oral Daily   Chlorhexidine Gluconate Cloth  6 each Topical Daily   enoxaparin (LOVENOX) injection  60 mg Subcutaneous Q24H   lisinopril  20 mg Oral Daily   melatonin  3 mg Oral QHS   metoprolol succinate  25 mg Oral Daily   Continuous Infusions:  nitroGLYCERIN 5 mcg/min (05/08/22 1800)   PRN Meds: acetaminophen **OR** acetaminophen, hydrALAZINE, ondansetron **OR** ondansetron (ZOFRAN) IV, mouth rinse, polyethylene glycol   Vital Signs    Vitals:   05/09/22 0930 05/09/22 0945 05/09/22 1000 05/09/22 1015  BP: (!) 160/76 (!) 184/104 (!) 161/86 (!) 170/112  Pulse: 95 98 94 96  Resp: (!) 24 (!) 23 19 (!) 23  Temp:      TempSrc:      SpO2: 93% 96% 97% 98%  Weight:      Height:        Intake/Output Summary (Last 24 hours) at 05/09/2022 1048 Last data filed at 05/08/2022 2000 Gross per 24 hour  Intake 43.49 ml  Output 525 ml  Net -481.51 ml      05/08/2022   12:45 AM 05/07/2022    7:28 PM 09/01/2020    8:59 AM  Last 3 Weights  Weight (lbs) 285 lb 11.5 oz 290 lb 284 lb 3.2 oz  Weight (kg) 129.6 kg 131.543 kg 128.912 kg      Telemetry    Sinus rhythm - Personally Reviewed  ECG    No new tracings - Personally Reviewed  Physical Exam   GEN: No acute distress. Resting comfortably in the bed  Neck: No JVD Cardiac: RRR, no murmurs, rubs, or gallops. Radial pulses 2+ bilaterally  Respiratory: Clear to auscultation bilaterally. On room air. No increased WOB  GI: Soft, nontender, non-distended  MS: No edema; No deformity. Neuro:  Nonfocal  Psych:  Normal affect   Labs    High Sensitivity Troponin:   Recent Labs  Lab 05/07/22 1948 05/07/22 2128 05/08/22 0546 05/08/22 1141  TROPONINIHS 77* 116* 126* 125*     Chemistry Recent Labs  Lab 05/07/22 1948 05/08/22 1144 05/09/22 0246  NA 137 137 138  K 3.3* 3.5 3.5  CL 103 104 104  CO2 26 23 25   GLUCOSE 100* 92 96  BUN 15 13 16   CREATININE 1.35* 1.18 1.20  CALCIUM 8.9 9.2 9.1  MG  --   --  2.1  PROT  --   --  7.1  ALBUMIN  --   --  3.7  AST  --   --  19  ALT  --   --  33  ALKPHOS  --   --  46  BILITOT  --   --  1.0  GFRNONAA >60 >60 >60  ANIONGAP 8 10 9     Lipids  Recent Labs  Lab 05/08/22 0546  CHOL 190  TRIG 84  HDL 31*  LDLCALC 142*  CHOLHDL 6.1    Hematology Recent Labs  Lab 05/07/22 1948 05/09/22 0246  WBC 9.9 7.9  RBC 6.06*  5.87*  HGB 16.1 15.7  HCT 48.7 48.6  MCV 80.4 82.8  MCH 26.6 26.7  MCHC 33.1 32.3  RDW 13.2 13.4  PLT 238 205   Thyroid No results for input(s): "TSH", "FREET4" in the last 168 hours.  BNP Recent Labs  Lab 05/08/22 0546  BNP 69.3    DDimer  Recent Labs  Lab 05/07/22 2128  DDIMER 0.31     Radiology    CT Angio Chest/Abd/Pel for Dissection W and/or Wo Contrast  Result Date: 05/08/2022 CLINICAL DATA:  Chest pain on nitroglycerin drip, evaluate for acute aortic syndrome EXAM: CT ANGIOGRAPHY CHEST, ABDOMEN AND PELVIS TECHNIQUE: Non-contrast CT of the chest was initially obtained. Multidetector CT imaging through the chest, abdomen and pelvis was performed using the standard protocol during bolus administration of intravenous contrast. Multiplanar reconstructed images and MIPs were obtained and reviewed to evaluate the vascular anatomy. RADIATION DOSE REDUCTION: This exam was performed according to the departmental dose-optimization program which includes automated exposure control, adjustment of the mA and/or kV according to patient size and/or use of iterative reconstruction technique. CONTRAST:  OMNIPAQUE  IOHEXOL 350 MG/ML SOLN COMPARISON:  None Available. FINDINGS: CTA CHEST FINDINGS Cardiovascular: Preferential opacification of the thoracic aorta. No evidence of thoracic aortic aneurysm or dissection. Heart size is normal. No pericardial effusion. Atheromatous calcification of left-sided coronary arteries. Mediastinum/Nodes: Negative for adenopathy or hematoma Lungs/Pleura: Lungs are clear. No pleural effusion or pneumothorax. Musculoskeletal: Thoracic spondylosis. Review of the MIP images confirms the above findings. CTA ABDOMEN AND PELVIS FINDINGS VASCULAR Aorta: Normal caliber aorta without aneurysm, dissection, vasculitis or significant stenosis. Celiac: Unremarkable SMA: Unremarkable Renals: Single bilateral renal arteries are smoothly contoured and widely patent IMA: Unremarkable Inflow: Unremarkable Veins: Negative in the arterial phase Review of the MIP images confirms the above findings. NON-VASCULAR Hepatobiliary: No focal liver abnormality.No evidence of biliary obstruction or stone. Pancreas: Unremarkable. Spleen: Unremarkable. Adrenals/Urinary Tract: Negative adrenals. No hydronephrosis or stone. Unremarkable bladder. Stomach/Bowel:  No obstruction. No visible inflammation. Lymphatic: No mass or adenopathy. Reproductive:No pathologic findings. Other: No ascites or pneumoperitoneum. Musculoskeletal: No acute abnormalities. Review of the MIP images confirms the above findings. IMPRESSION: 1. Negative for acute aortic syndrome. 2. Coronary calcification. Electronically Signed   By: Shawn James M.D.   On: 05/08/2022 04:23   DG Chest 2 View  Result Date: 05/07/2022 CLINICAL DATA:  Chest pain starting 2 hours prior to admission. EXAM: CHEST - 2 VIEW COMPARISON:  02/24/2020 FINDINGS: The heart size and mediastinal contours are within normal limits. Both lungs are clear. The visualized skeletal structures are unremarkable. IMPRESSION: No active cardiopulmonary disease. Electronically Signed   By:  Shawn James M.D.   On: 05/07/2022 19:47    Cardiac Studies     Patient Profile     41 y.o. male with a hx of HTN, obesity, migraine headaches, OSA on CPAP who is being seen for the evaluation of chest pain at the request of Dr. Jerolyn Center.  Assessment & Plan      Hypertensive Crisis  - BP continues to be high, even after resumption of home BP medications  - Continue home amlodipine 10 mg daily - Consider transitioning from home lisinopril to irbesartan for better BP control. Could also transition from metoprolol to carvedilol or labetalol  - On nitro drip-- being titrated down    Elevated troponin  Chest Pain  - hsTn 77>>116>>126>>125 - Troponin did trend upward from 77 up to 116, but appear to be otherwise flat  -  Likely that this represent troponin leakage in the setting of hypertensive crisis, chest pain has resolved with BP improvement - However, patient did have some atheromatous calcification of the left-sided coronary arteries on chest CTA. Does have risk factors for CAD including HLD, HTN, obesity - Echocardiogram as above  - Patient has ben chest pain free, even when BP has been high  - If patient has recurrence of chest pain when BP is improved, we have a low threshold for cath.  - For now, I suspect we can defer ischemic eval and continue to follow patient in the outpatient setting as patient has been chest pain free for a few days as BP is improving  - Continue statin, asa    HLD  - LDL 142, HDL 31, triglycerides 84 this admission  - Started Lipitor 80 mg daily - will need LFTs and lipid panel in 2-3 months     MD to see   For questions or updates, please contact [facility] Please consult [email or web address] for contact info under        Signed, Jonita Albee, PA-C  05/09/2022, 10:48 AM    Personally seen and examined. Agree with above.  No further chest discomfort.  Further BP control per recommendations above.  Follow-up with primary care  physician.  We will go ahead and sign off.  Please let us know reviewed further assistance.  Donato Schultz, MD

## 2022-05-09 NOTE — Progress Notes (Addendum)
PROGRESS NOTE    Shawn James  WEX:[id number] DOB: 1981/07/06 DOA: 05/07/2022 PCP: Anne Ng, NP     Brief Narrative:   41 year old male with past medical history of hypertension, obesity, migraine headaches and sleep apnea (on CPAP 6-18cm H20) who presents to [facility] emergency department with complaints of chest pain, initial blood pressure 200/135, high sensitive troponin peak at 126, EKG with LVH T wave inversion lateral leads, cardiology consulted  CTA showed coronary calcification Echocardiogram pending  Subjective:  Blood pressure still fluctuating between 150 to 170, he denies headache or chest pain No edema,  No cough   Assessment & Plan:  Principal Problem:   Hypertensive crisis Active Problems:   Chest pain   Elevated troponin level not due myocardial infarction   Class 2 severe obesity due to excess calories with serious comorbidity and body mass index (BMI) of 39.0 to 39.9 in adult (HCC)   OSA on CPAP    Assessment and Plan:  Hypertensive emergency with elevated troponin Patient presenting with significant chest pain in the setting of extremely uncontrolled blood pressures Patient reports compliance with his antihypertensive regimen, cpap and exercise three times a day, weight has been stable Nitroglycerin infusion was initiated by ER provider, currently being titrated in the stepdown unit Resuming home antihypertensive therapy followed by attempting to wean off nitroglycerin infusion Suspect markedly elevated blood pressure is the cause of the patient's elevated troponin and chest discomfort Blood pressure persistently elevated despite on restarting multiple blood pressures meds, CTA chest abdomen pelvis did not show any coarctation of aorta, no renal artery stenosis, no adrenal lesion reported, will add on UA, UDS, urine metanephrines, urine cortisol, serum Aldo renin ratio ( not sure how reliable this will be , being on acei), TSH  Pm addendum: Blood  pressure not well controlled, will schedule hydralazine, start Imdur  Chest pain with troponin elevation in the setting of uncontrolled hypertension D-dimer unremarkable High-sensitivity troponin peak at 126  HgbA1C 5.3  lipid panel  ldl 142 CTA chest showed aortic calcification Echocardiogram preserved LVEF, moderate concentric left ventricular hypertrophy with grade 1 diastolic dysfunction Daily aspirin, statin, beta-blocker Appreciate cardiology input, currently no plan for invasive cardiac work-up   Class 2 severe obesity due to excess calories with serious comorbidity and body mass index (BMI) of 39.0 to 39.9 in adult [facility]) Once patient is clinically improving, will counsel patient on caloric restriction and regular physical activity.    OSA on CPAP Continuing home regimen of CPAP nightly      I have Reviewed nursing notes, Vitals, pain scores, I/o's, Lab results and  imaging results since pt's last encounter, details please see discussion above  I ordered the following labs:  Unresulted Labs (From admission, onward)    None        DVT prophylaxis:   Lovenox   Code Status:   Code Status: Full Code  Family Communication: patient Disposition:     Dispo: The patient is from: Home              Anticipated d/c is to: Home              Anticipated d/c date is: Remain in stepdown for uncontrolled hypertension  Antimicrobials:   Anti-infectives (From admission, onward)    None           Objective: Vitals:   05/09/22 0400 05/09/22 0430 05/09/22 0500 05/09/22 0530  BP: (!) 146/106 (!) 162/112 (!) 168/106 (!) 178/117  Pulse: 67 97  88 91  Resp: 11 (!) 23 (!) 26 19  Temp: 98.3 F (36.8 C)     TempSrc: Oral     SpO2: 92% 93% 94% 100%  Weight:      Height:        Intake/Output Summary (Last 24 hours) at 05/09/2022 0726 Last data filed at 05/08/2022 2000 Gross per 24 hour  Intake 61.94 ml  Output 825 ml  Net -763.06 ml   Filed Weights   05/07/22  1928 05/08/22 0045  Weight: 131.5 kg 129.6 kg    Examination:  General exam: alert, awake, communicative,calm, NAD Respiratory system: Clear to auscultation. Respiratory effort normal. Cardiovascular system:  RRR.  Gastrointestinal system: Abdomen is nondistended, soft and nontender.  Normal bowel sounds heard. Central nervous system: Alert and oriented. No focal neurological deficits. Extremities:  no edema Skin: No rashes, lesions or ulcers Psychiatry: Judgement and insight appear normal. Mood & affect appropriate.     Data Reviewed: I have personally reviewed  labs and visualized  imaging studies since the last encounter and formulate the plan        Scheduled Meds:  amLODipine  10 mg Oral QHS   aspirin  81 mg Oral Daily   atorvastatin  80 mg Oral Daily   Chlorhexidine Gluconate Cloth  6 each Topical Daily   enoxaparin (LOVENOX) injection  60 mg Subcutaneous Q24H   lisinopril  20 mg Oral Daily   melatonin  3 mg Oral QHS   metoprolol succinate  25 mg Oral Daily   Continuous Infusions:  nitroGLYCERIN 5 mcg/min (05/08/22 1800)     LOS: 1 day    Albertine Grates, MD PhD FACP [facility]  Available via Epic secure chat 7am-7pm for nonurgent issues Please page for urgent issues To page the attending provider between 7A-7P or the covering provider during after hours 7P-7A, please log into the web site [email or web address] and access using universal [place] password for that web site. If you do not have the password, please call the hospital operator.    05/09/2022, 7:26 AM

## 2022-05-09 NOTE — Progress Notes (Signed)
Pt seen, not ready for sleep yet.  RN will notify this Clinical research associate when pt is ready for cpap.

## 2022-05-10 LAB — CORTISOL: Cortisol, Plasma: 5.3 ug/dL

## 2022-05-10 LAB — TSH: TSH: 1.151 u[IU]/mL (ref 0.350–4.500)

## 2022-05-10 MED ORDER — BUTALBITAL-APAP-CAFFEINE 50-325-40 MG PO TABS
1.0000 | ORAL_TABLET | Freq: Once | ORAL | Status: AC
  Administered 2022-05-10: 1 via ORAL
  Filled 2022-05-10: qty 1

## 2022-05-10 MED ORDER — HYDRALAZINE HCL 10 MG PO TABS
10.0000 mg | ORAL_TABLET | Freq: Three times a day (TID) | ORAL | Status: DC
  Administered 2022-05-10 – 2022-05-11 (×3): 10 mg via ORAL
  Filled 2022-05-10 (×3): qty 1

## 2022-05-10 NOTE — Progress Notes (Addendum)
Pt stated he will call when he is ready for cpap tonight.  Machine on standby in room.

## 2022-05-10 NOTE — Progress Notes (Signed)
PROGRESS NOTE    Shawn James  OFB:[id number] DOB: 08/21/1981 DOA: 05/07/2022 PCP: Anne Ng, NP     Brief Narrative:   41 year old male with past medical history of hypertension, obesity, migraine headaches and sleep apnea (on CPAP 6-18cm H20) who presents to [facility] emergency department with complaints of chest pain, initial blood pressure 200/135, high sensitive troponin peak at 126, EKG with LVH T wave inversion lateral leads, cardiology consulted  CTA showed coronary calcification Echocardiogram pending  Subjective:  Systolic blood pressure around 190 yesterday evening, blood pressure has improved overnight, However blood pressure started to trend up during the daytime again  He received Fioricet and melatonin midnight last night    Assessment & Plan:  Principal Problem:   Hypertensive crisis Active Problems:   Chest pain   Elevated troponin level not due myocardial infarction   Class 2 severe obesity due to excess calories with serious comorbidity and body mass index (BMI) of 39.0 to 39.9 in adult (HCC)   OSA on CPAP    Assessment and Plan:  Hypertensive emergency with elevated troponin -Blood pressure persistently elevated despite on restarting multiple blood pressures meds -CTA chest abdomen pelvis did not show any coarctation of aorta, no renal artery stenosis, no adrenal lesion reported -check UA, UDS, urine metanephrines, urine cortisol, serum Aldo renin ratio ( not sure how reliable this will be , being on acei), TSH -Blood pressure not well controlled, continue stay in stepdown, continue to adjust bp meds  Chest pain with troponin elevation in the setting of uncontrolled hypertension D-dimer unremarkable High-sensitivity troponin peak at 126  HgbA1C 5.3  lipid panel  ldl 142 CTA chest showed aortic calcification Echocardiogram preserved LVEF, moderate concentric left ventricular hypertrophy with grade 1 diastolic dysfunction Daily aspirin,  statin, beta-blocker Appreciate cardiology input, currently no plan for invasive cardiac work-up  Obesity  Body mass index is 38.65 kg/m. Lifestyle modification  OSA on CPAP Continuing home regimen of CPAP nightly      I have Reviewed nursing notes, Vitals, pain scores, I/o's, Lab results and  imaging results since pt's last encounter, details please see discussion above  I ordered the following labs:  Unresulted Labs (From admission, onward)     Start     Ordered   05/11/22 0500  CBC with Differential/Platelet  Tomorrow morning,   R       Question:  Specimen collection method  Answer:  Lab=Lab collect   05/10/22 1933   05/11/22 0500  Basic metabolic panel  Tomorrow morning,   R       Question:  Specimen collection method  Answer:  Lab=Lab collect   05/10/22 1933   05/11/22 0500  Magnesium  Tomorrow morning,   R       Question:  Specimen collection method  Answer:  Lab=Lab collect   05/10/22 1933   05/10/22 0500  Aldosterone + renin activity w/ ratio  Tomorrow morning,   R       Question:  Specimen collection method  Answer:  Lab=Lab collect   05/09/22 1621   05/09/22 1623  Cortisol, urine, free  Once,   R        05/09/22 1622   05/09/22 1618  Catecholamines,Ur.,Free,24 Hh  Once,   R        05/09/22 1617   05/09/22 1611  Metanephrines, urine, 24 hour  Once,   R        05/09/22 1610  DVT prophylaxis:   Lovenox   Code Status:   Code Status: Full Code  Family Communication: patient Disposition:     Dispo: The patient is from: Home              Anticipated d/c is to: Home              Anticipated d/c date is: Remain in stepdown for uncontrolled hypertension  Antimicrobials:   Anti-infectives (From admission, onward)    None           Objective: Vitals:   05/10/22 1730 05/10/22 1800 05/10/22 1830 05/10/22 1904  BP: (!) 150/105 (!) 157/97 (!) 167/113   Pulse: 92 (!) 108 88   Resp: 15 (!) 21 (!) 22 11  Temp:      TempSrc:      SpO2:  97% 99% 97%   Weight:      Height:        Intake/Output Summary (Last 24 hours) at 05/10/2022 1933 Last data filed at 05/10/2022 0800 Gross per 24 hour  Intake 121.19 ml  Output 675 ml  Net -553.81 ml   Filed Weights   05/07/22 1928 05/08/22 0045 05/10/22 0423  Weight: 131.5 kg 129.6 kg 125.7 kg    Examination:  General exam: alert, awake, communicative,calm, NAD Respiratory system: Clear to auscultation. Respiratory effort normal. Cardiovascular system:  RRR.  Gastrointestinal system: Abdomen is nondistended, soft and nontender.  Normal bowel sounds heard. Central nervous system: Alert and oriented. No focal neurological deficits. Extremities:  no edema Skin: No rashes, lesions or ulcers Psychiatry: Judgement and insight appear normal. Mood & affect appropriate.     Data Reviewed: I have personally reviewed  labs and visualized  imaging studies since the last encounter and formulate the plan        Scheduled Meds:  amLODipine  10 mg Oral QHS   aspirin  81 mg Oral Daily   atorvastatin  80 mg Oral Daily   Chlorhexidine Gluconate Cloth  6 each Topical Daily   enoxaparin (LOVENOX) injection  60 mg Subcutaneous Q24H   hydrALAZINE  10 mg Oral Q8H   isosorbide mononitrate  15 mg Oral Daily   lisinopril  20 mg Oral Daily   melatonin  3 mg Oral QHS   metoprolol succinate  25 mg Oral Daily   Continuous Infusions:  nitroGLYCERIN Stopped (05/08/22 1935)     LOS: 2 days    Albertine Grates, MD PhD FACP [facility]  Available via Epic secure chat 7am-7pm for nonurgent issues Please page for urgent issues To page the attending provider between 7A-7P or the covering provider during after hours 7P-7A, please log into the web site [email or web address] and access using universal [place] password for that web site. If you do not have the password, please call the hospital operator.    05/10/2022, 7:33 PM

## 2022-05-11 LAB — CBC WITH DIFFERENTIAL/PLATELET
Abs Immature Granulocytes: 0.03 10*3/uL (ref 0.00–0.07)
Basophils Absolute: 0 10*3/uL (ref 0.0–0.1)
Basophils Relative: 0 %
Eosinophils Absolute: 0.1 10*3/uL (ref 0.0–0.5)
Eosinophils Relative: 1 %
HCT: 47.6 % (ref 39.0–52.0)
Hemoglobin: 15.3 g/dL (ref 13.0–17.0)
Immature Granulocytes: 0 %
Lymphocytes Relative: 16 %
Lymphs Abs: 1.5 10*3/uL (ref 0.7–4.0)
MCH: 26.7 pg (ref 26.0–34.0)
MCHC: 32.1 g/dL (ref 30.0–36.0)
MCV: 82.9 fL (ref 80.0–100.0)
Monocytes Absolute: 0.5 10*3/uL (ref 0.1–1.0)
Monocytes Relative: 6 %
Neutro Abs: 7.1 10*3/uL (ref 1.7–7.7)
Neutrophils Relative %: 77 %
Platelets: 232 10*3/uL (ref 150–400)
RBC: 5.74 MIL/uL (ref 4.22–5.81)
RDW: 13.3 % (ref 11.5–15.5)
WBC: 9.2 10*3/uL (ref 4.0–10.5)
nRBC: 0 % (ref 0.0–0.2)

## 2022-05-11 LAB — BASIC METABOLIC PANEL
Anion gap: 8 (ref 5–15)
BUN: 17 mg/dL (ref 6–20)
CO2: 25 mmol/L (ref 22–32)
Calcium: 9.2 mg/dL (ref 8.9–10.3)
Chloride: 106 mmol/L (ref 98–111)
Creatinine, Ser: 1.06 mg/dL (ref 0.61–1.24)
GFR, Estimated: >60 mL/min (ref >60)
Glucose, Bld: 93 mg/dL (ref 70–99)
Potassium: 3.6 mmol/L (ref 3.5–5.1)
Sodium: 139 mmol/L (ref 135–145)

## 2022-05-11 LAB — MAGNESIUM: Magnesium: 2 mg/dL (ref 1.7–2.4)

## 2022-05-11 MED ORDER — LISINOPRIL 20 MG PO TABS: 20.0000 mg | ORAL_TABLET | Freq: Every day | ORAL | 0 refills | Status: DC

## 2022-05-11 MED ORDER — ISOSORBIDE MONONITRATE ER 30 MG PO TB24: 15.0000 mg | ORAL_TABLET | Freq: Every day | ORAL | 0 refills | Status: DC

## 2022-05-11 MED ORDER — AMLODIPINE BESYLATE 10 MG PO TABS: 10.0000 mg | ORAL_TABLET | Freq: Every day | ORAL | 0 refills | Status: DC

## 2022-05-11 MED ORDER — ASPIRIN 81 MG PO CHEW: 81.0000 mg | CHEWABLE_TABLET | Freq: Every day | ORAL | 0 refills | Status: DC

## 2022-05-11 MED ORDER — METOPROLOL SUCCINATE ER 25 MG PO TB24: 25.0000 mg | ORAL_TABLET | Freq: Every day | ORAL | 0 refills | Status: DC

## 2022-05-11 MED ORDER — HYDRALAZINE HCL 10 MG PO TABS: 10.0000 mg | ORAL_TABLET | Freq: Three times a day (TID) | ORAL | 0 refills | Status: DC

## 2022-05-11 MED ORDER — ATORVASTATIN CALCIUM 80 MG PO TABS: 80.0000 mg | ORAL_TABLET | Freq: Every day | ORAL | 0 refills | Status: DC

## 2022-05-11 NOTE — TOC Transition Note (Signed)
Transition of Care [facility]) - CM/SW Discharge Note   Patient Details  Name: Tyjah Schuller MRN: [id number] Date of Birth: Jun 10, 1981  Transition of [facility]) CM/SW Contact:  Lanier Clam, RN Phone Number: 05/11/2022, 12:01 PM   Clinical Narrative: d/c home No further CM needs.      Final next level of care: Home/Self Care Barriers to Discharge: No Barriers Identified   Patient Goals and CMS Choice Patient states their goals for this hospitalization and ongoing recovery are:: Home CMS Medicare.gov Compare Post Acute Care list provided to:: Patient Choice offered to / list presented to : Patient  Discharge Placement                       Discharge Plan and Services   Discharge Planning Services: CM Consult                                 Social Determinants of Health (SDOH) Interventions     Readmission Risk Interventions     No data to display

## 2022-05-15 LAB — CORTISOL, URINE, FREE
Cortisol (Ur), Free: 46 ug/24 hr (ref 5–64)
Cortisol,F,ug/L,U: 33 ug/L
Total Volume: 1400

## 2022-05-16 LAB — METANEPHRINES, URINE, 24 HOUR
Metaneph Total, Ur: 153 ug/L
Metanephrines, 24H Ur: 214 ug/24 hr (ref 58–276)
Normetanephrine, 24H Ur: 825 ug/24 hr — ABNORMAL HIGH (ref 156–729)
Normetanephrine, Ur: 589 ug/L
Total Volume: 1400

## 2022-05-22 LAB — ALDOSTERONE + RENIN ACTIVITY W/ RATIO
ALDO / PRA Ratio: 3 (ref 0.0–30.0)
Aldosterone: 10.2 ng/dL (ref 0.0–30.0)
PRA LC/MS/MS: 3.379 ng/mL/hr (ref 0.167–5.380)

## 2022-06-13 ENCOUNTER — Encounter (INDEPENDENT_AMBULATORY_CARE_PROVIDER_SITE_OTHER): Payer: BC Managed Care – PPO | Admitting: Cardiology

## 2022-06-13 DIAGNOSIS — I10 Essential (primary) hypertension: Secondary | ICD-10-CM | POA: Diagnosis not present

## 2022-06-14 ENCOUNTER — Other Ambulatory Visit: Payer: Self-pay | Admitting: Cardiology

## 2022-06-14 DIAGNOSIS — I10 Essential (primary) hypertension: Secondary | ICD-10-CM

## 2022-06-14 MED ORDER — METOPROLOL SUCCINATE ER 25 MG PO TB24: 25.0000 mg | ORAL_TABLET | Freq: Every day | ORAL | 3 refills | Status: AC

## 2022-06-14 MED ORDER — AMLODIPINE BESYLATE 10 MG PO TABS: 10.0000 mg | ORAL_TABLET | Freq: Every day | ORAL | 3 refills | Status: AC

## 2022-06-14 MED ORDER — LISINOPRIL 20 MG PO TABS: 20.0000 mg | ORAL_TABLET | Freq: Every day | ORAL | 3 refills | Status: DC

## 2022-06-14 MED ORDER — HYDRALAZINE HCL 10 MG PO TABS: 10.0000 mg | ORAL_TABLET | Freq: Three times a day (TID) | ORAL | 3 refills | Status: DC

## 2022-06-14 MED ORDER — ATORVASTATIN CALCIUM 80 MG PO TABS: 80.0000 mg | ORAL_TABLET | Freq: Every day | ORAL | 3 refills | Status: AC

## 2022-06-14 MED ORDER — ISOSORBIDE MONONITRATE ER 30 MG PO TB24: 15.0000 mg | ORAL_TABLET | Freq: Every day | ORAL | 3 refills | Status: AC

## 2022-06-14 MED ORDER — ASPIRIN 81 MG PO CHEW: 81.0000 mg | CHEWABLE_TABLET | Freq: Every day | ORAL | 3 refills | Status: AC

## 2022-06-14 NOTE — Telephone Encounter (Signed)
Please see the MyChart message reply(ies) for my assessment and plan.    This patient gave consent for this Medical Advice Message and is aware that it may result in a bill to their insurance company, as well as the possibility of receiving a bill for a co-payment or deductible. They are an established patient, but are not seeking medical advice exclusively about a problem treated during an in person or video visit in the last seven days. I did not recommend an in person or video visit within seven days of my reply.    I spent a total of 6 minutes cumulative time within 7 days through MyChart messaging.  Naama Sappington, MD   

## 2022-06-27 ENCOUNTER — Encounter (INDEPENDENT_AMBULATORY_CARE_PROVIDER_SITE_OTHER): Payer: Self-pay

## 2022-06-27 ENCOUNTER — Encounter: Payer: BC Managed Care – PPO | Admitting: Family

## 2022-06-28 NOTE — Progress Notes (Signed)
  This encounter was created in error - please disregard. No show 

## 2022-07-02 ENCOUNTER — Telehealth: Payer: Self-pay

## 2022-07-02 NOTE — Telephone Encounter (Signed)
Care everywhere 

## 2022-07-05 ENCOUNTER — Ambulatory Visit: Payer: BC Managed Care – PPO | Admitting: Adult Health

## 2022-07-09 ENCOUNTER — Ambulatory Visit: Payer: BC Managed Care – PPO | Admitting: Adult Health

## 2022-07-09 ENCOUNTER — Other Ambulatory Visit: Payer: Self-pay

## 2022-07-09 ENCOUNTER — Encounter: Payer: Self-pay | Admitting: Adult Health

## 2022-07-09 VITALS — BP 160/84 | HR 71 | Temp 97.8°F | Resp 18 | Ht 71.0 in | Wt 284.0 lb

## 2022-07-09 DIAGNOSIS — G4733 Obstructive sleep apnea (adult) (pediatric): Secondary | ICD-10-CM

## 2022-07-09 DIAGNOSIS — R079 Chest pain, unspecified: Secondary | ICD-10-CM | POA: Diagnosis not present

## 2022-07-09 DIAGNOSIS — Z0000 Encounter for general adult medical examination without abnormal findings: Secondary | ICD-10-CM

## 2022-07-09 DIAGNOSIS — I10 Essential (primary) hypertension: Secondary | ICD-10-CM

## 2022-07-09 DIAGNOSIS — M79671 Pain in right foot: Secondary | ICD-10-CM

## 2022-07-09 DIAGNOSIS — Z9989 Dependence on other enabling machines and devices: Secondary | ICD-10-CM

## 2022-07-09 DIAGNOSIS — Z6839 Body mass index (BMI) 39.0-39.9, adult: Secondary | ICD-10-CM

## 2022-07-09 NOTE — Progress Notes (Signed)
Location:  [facility]  Provider:  Kenard Gower DNP  Code Status:  Full Code  Goals of Care:     07/09/2022    8:38 AM  Advanced Directives  Does Patient Have a Medical Advance Directive? No  Would patient like information on creating a medical advance directive? No - Patient declined     Chief Complaint  Patient presents with   Establish Care    New patient here to establish care, [facility] verified, patient has medication bottles at initial visit.    HPI: Patient is a 41 y.o. male seen today to establish care with [facility]. He has a PMH of hypertension, obesity, migraine headaches and sleep apnea (on CPAP 6-18 cm H2O), and hypertension retinopathy. He was hospitalized 05/07/22 to 05/11/22 for hypertensive crisis and chest pain. It was suspected that he has longstanding uncontrolled hypertension due to end-organ damage - hypertension retinopathy (by ophthalmology documentation from 11/2020) and moderated concentric left ventricular hypertrophy on echocardiogram. He was initially started on nitro drip. He, also, complained of chest pain in the hospital and cardiology was consulted.  CTA chest showed aortic calcification and atheromatous calcification of left-sided coronary arteries.  Echocardiogram preserved LVEF, moderate concentric left ventricular hypertrophy with grade 1 diastolic dysfunction.  He will follow-up with cardiology outpatient.  He was seen in the clinic today with FMLA papers from work. He complained of right foot pain after work. 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 He denies having trauma to it.    Past Medical History:  Diagnosis Date   Bursitis    Chicken pox    Hypertension    Migraine    Obesity     History reviewed. No pertinent surgical history.  No Known Allergies  Outpatient Encounter Medications as of 07/09/2022  Medication Sig   amLODipine (NORVASC) 10 MG tablet Take 1 tablet (10 mg  total) by mouth at bedtime.   aspirin 81 MG chewable tablet Chew 1 tablet (81 mg total) by mouth daily.   atorvastatin (LIPITOR) 80 MG tablet Take 1 tablet (80 mg total) by mouth daily.   diphenhydrAMINE (BENADRYL) 25 mg capsule Take 25 mg by mouth every 6 (six) hours as needed for allergies.   isosorbide mononitrate (IMDUR) 30 MG 24 hr tablet Take 0.5 tablets (15 mg total) by mouth daily.   lisinopril (ZESTRIL) 20 MG tablet Take 1 tablet (20 mg total) by mouth daily.   metoprolol succinate (TOPROL-XL) 25 MG 24 hr tablet Take 1 tablet (25 mg total) by mouth daily.   [DISCONTINUED] diphenhydramine-acetaminophen (TYLENOL PM) 25-500 MG TABS tablet Take 2 tablets by mouth at bedtime as needed (sleep).   [DISCONTINUED] diphenhydrAMINE-PE-APAP (THERAFLU EXPRESSMAX) 12.5-5-325 MG/15ML LIQD Take 15 mLs by mouth every 12 (twelve) hours as needed (congestion).   [DISCONTINUED] fluticasone (FLONASE) 50 MCG/ACT nasal spray Place 2 sprays into both nostrils daily. (Patient not taking: Reported on 02/24/2020)   [DISCONTINUED] guaiFENesin (MUCINEX) 600 MG 12 hr tablet Take 600 mg by mouth 2 (two) times daily as needed for to loosen phlegm.   [DISCONTINUED] hydrALAZINE (APRESOLINE) 10 MG tablet Take 1 tablet (10 mg total) by mouth every 8 (eight) hours.   [DISCONTINUED] Oxymetazoline HCl-Menthol (AFRIN MENTHOL SPRAY) 0.05 % SOLN Place 1 spray into both nostrils every 12 (twelve) hours as needed (congestion).   No facility-administered encounter medications on file as of 07/09/2022.    Review of Systems:  Review of Systems  Constitutional:  Negative for activity change, appetite change and fever.  HENT:  Negative for sore throat.   Eyes: Negative.   Cardiovascular:  Negative for chest pain and leg swelling.  Gastrointestinal:  Negative for abdominal distention, diarrhea and vomiting.  Genitourinary:  Negative for dysuria, frequency and urgency.  Skin:  Negative for color change.  Neurological:  Negative for  dizziness and headaches.  Psychiatric/Behavioral:  Negative for behavioral problems and sleep disturbance. The patient is not nervous/anxious.     Health Maintenance  Topic Date Due   COVID-19 Vaccine (1) Never done   Hepatitis C Screening  Never done   INFLUENZA VACCINE  06/19/2022   TETANUS/TDAP  05/14/2026   HIV Screening  Completed   HPV VACCINES  Aged Out    Physical Exam: Vitals:   07/09/22 0846  BP: (!) 160/84  Pulse: 71  Resp: 18  Temp: 97.8 F (36.6 C)  TempSrc: Temporal  SpO2: 98%  Weight: 284 lb (128.8 kg)  Height: 5\' 11"  (1.803 m)   Body mass index is 39.61 kg/m. Physical Exam Constitutional:      General: He is not in acute distress.    Appearance: He is obese.  HENT:     Head: Normocephalic and atraumatic.     Mouth/Throat:     Mouth: Mucous membranes are moist.  Eyes:     Conjunctiva/sclera: Conjunctivae normal.  Cardiovascular:     Rate and Rhythm: Normal rate and regular rhythm.     Pulses: Normal pulses.     Heart sounds: Normal heart sounds.  Pulmonary:     Effort: Pulmonary effort is normal.     Breath sounds: Normal breath sounds.  Abdominal:     General: Bowel sounds are normal.     Palpations: Abdomen is soft.  Musculoskeletal:        General: No swelling. Normal range of motion.     Cervical back: Normal range of motion.  Skin:    General: Skin is warm and dry.  Neurological:     General: No focal deficit present.     Mental Status: He is alert and oriented to person, place, and time.  Psychiatric:        Mood and Affect: Mood normal.        Behavior: Behavior normal.        Thought Content: Thought content normal.        Judgment: Judgment normal.     Labs reviewed: Basic Metabolic Panel: Recent Labs    05/08/22 1144 05/09/22 0246 05/10/22 0310 05/11/22 0304  NA 137 138  --  139  K 3.5 3.5  --  3.6  CL 104 104  --  106  CO2 23 25  --  25  GLUCOSE 92 96  --  93  BUN 13 16  --  17  CREATININE 1.18 1.20  --  1.06   CALCIUM 9.2 9.1  --  9.2  MG  --  2.1  --  2.0  TSH  --   --  1.151  --    Liver Function Tests: Recent Labs    05/09/22 0246  AST 19  ALT 33  ALKPHOS 46  BILITOT 1.0  PROT 7.1  ALBUMIN 3.7   No results for input(s): "LIPASE", "AMYLASE" in the last 8760 hours. No results for input(s): "AMMONIA" in the last 8760 hours. CBC: Recent Labs    05/07/22 1948 05/09/22 0246 05/11/22 0304  WBC 9.9 7.9 9.2  NEUTROABS  --  5.7 7.1  HGB 16.1 15.7 15.3  HCT 48.7 48.6 47.6  MCV 80.4 82.8 82.9  PLT 238 205 232   Lipid Panel: Recent Labs    05/08/22 0546  CHOL 190  HDL 31*  LDLCALC 142*  TRIG 84  CHOLHDL 6.1   Lab Results  Component Value Date   HGBA1C 5.3 05/08/2022    Procedures since last visit: No results found.  Assessment/Plan  1. Essential hypertension -  advised to check BP and log daily -  continue  Lisinopril, Metoprolol XL and Amlodipine  2. Chest pain, unspecified type -  denies chest pains -  continue ASA, Lipitor, Isosorbide MN and Lopressor  3. Class 2 severe obesity due to excess calories with serious comorbidity and body mass index (BMI) of 39.0 to 39.9 in adult [facility]) Body mass index is 39.61 kg/m. -  plans to work out everyday, is now a member of a gym and apartment complex has a treadmill that he plans to use -  has a goal of losing 1 lb/week  4. OSA on CPAP -  wears CPAP at night  5. Routine adult health maintenance - Hepatitis C Antibody - Hep B Surface Antibody  6. Right foot pain -  no edema or erythema noted - DG Foot 2 Views Right; Future     Labs/tests ordered:   Hep B and C antibody  Next appt:  in a month

## 2022-07-09 NOTE — Patient Instructions (Signed)
Preventive Care 21-39 Years Old, Male Preventive care refers to lifestyle choices and visits with your health care provider that can promote health and wellness. Preventive care visits are also called wellness exams. What can I expect for my preventive care visit? Counseling During your preventive care visit, your health care provider may ask about your: Medical history, including: Past medical problems. Family medical history. Current health, including: Emotional well-being. Home life and relationship well-being. Sexual activity. Lifestyle, including: Alcohol, nicotine or tobacco, and drug use. Access to firearms. Diet, exercise, and sleep habits. Safety issues such as seatbelt and bike helmet use. Sunscreen use. Work and work environment. Physical exam Your health care provider may check your: Height and weight. These may be used to calculate your BMI (body mass index). BMI is a measurement that tells if you are at a healthy weight. Waist circumference. This measures the distance around your waistline. This measurement also tells if you are at a healthy weight and may help predict your risk of certain diseases, such as type 2 diabetes and high blood pressure. Heart rate and blood pressure. Body temperature. Skin for abnormal spots. What immunizations do I need?  Vaccines are usually given at various ages, according to a schedule. Your health care provider will recommend vaccines for you based on your age, medical history, and lifestyle or other factors, such as travel or where you work. What tests do I need? Screening Your health care provider may recommend screening tests for certain conditions. This may include: Lipid and cholesterol levels. Diabetes screening. This is done by checking your blood sugar (glucose) after you have not eaten for a while (fasting). Hepatitis B test. Hepatitis C test. HIV (human immunodeficiency virus) test. STI (sexually transmitted infection)  testing, if you are at risk. Talk with your health care provider about your test results, treatment options, and if necessary, the need for more tests. Follow these instructions at home: Eating and drinking  Eat a healthy diet that includes fresh fruits and vegetables, whole grains, lean protein, and low-fat dairy products. Drink enough fluid to keep your urine pale yellow. Take vitamin and mineral supplements as recommended by your health care provider. Do not drink alcohol if your health care provider tells you not to drink. If you drink alcohol: Limit how much you have to 0-2 drinks a day. Know how much alcohol is in your drink. In the [place] one drink equals one 12 oz bottle of beer (355 mL), one 5 oz glass of wine (148 mL), or one 1 oz glass of hard liquor (44 mL). Lifestyle Brush your teeth every morning and night with fluoride toothpaste. Floss one time each day. Exercise for at least 30 minutes 5 or more days each week. Do not use any products that contain nicotine or tobacco. These products include cigarettes, chewing tobacco, and vaping devices, such as e-cigarettes. If you need help quitting, ask your health care provider. Do not use drugs. If you are sexually active, practice safe sex. Use a condom or other form of protection to prevent STIs. Find healthy ways to manage stress, such as: Meditation, yoga, or listening to music. Journaling. Talking to a trusted person. Spending time with friends and family. Minimize exposure to UV radiation to reduce your risk of skin cancer. Safety Always wear your seat belt while driving or riding in a vehicle. Do not drive: If you have been drinking alcohol. Do not ride with someone who has been drinking. If you have been using any mind-altering substances   or drugs. While texting. When you are tired or distracted. Wear a helmet and other protective equipment during sports activities. If you have firearms in your house, make sure you  follow all gun safety procedures. Seek help if you have been physically or sexually abused. What's next? Go to your health care provider once a year for an annual wellness visit. Ask your health care provider how often you should have your eyes and teeth checked. Stay up to date on all vaccines. This information is not intended to replace advice given to you by your health care provider. Make sure you discuss any questions you have with your health care provider. Document Revised: 05/03/2021 Document Reviewed: 05/03/2021 Elsevier Patient Education  2023 Elsevier Inc.  

## 2022-07-10 LAB — HEPATITIS B SURFACE ANTIBODY,QUALITATIVE: Hep B S Ab: REACTIVE — AB

## 2022-07-10 LAB — HEPATITIS C ANTIBODY: Hepatitis C Ab: NONREACTIVE

## 2022-07-10 NOTE — Progress Notes (Signed)
Hep C is non-reactive

## 2022-07-10 NOTE — Progress Notes (Signed)
If he had hep B vaccine then it means that he developed immunity to it. No action needed. What are his BP readings?

## 2022-07-10 NOTE — Progress Notes (Signed)
Hep B surface antibody is reactive, does he have any hep B vaccine. -  Urine metanephrine taken in the ED was slightly elevated and Dr. Albertine Grates recommended for it to be repeated.We can do it for next week. How is his BP readings? Will need to follow up soon for BP checks (a week?).

## 2022-07-16 ENCOUNTER — Encounter: Payer: Self-pay | Admitting: Adult Health

## 2022-07-20 ENCOUNTER — Ambulatory Visit: Payer: BC Managed Care – PPO | Admitting: Adult Health

## 2022-08-01 ENCOUNTER — Encounter: Payer: BC Managed Care – PPO | Admitting: Family

## 2022-08-01 DIAGNOSIS — Z23 Encounter for immunization: Secondary | ICD-10-CM

## 2022-08-16 ENCOUNTER — Encounter: Payer: Self-pay | Admitting: Adult Health

## 2022-08-16 ENCOUNTER — Ambulatory Visit: Payer: BC Managed Care – PPO | Admitting: Adult Health

## 2022-08-16 VITALS — BP 162/90 | HR 88 | Temp 97.5°F | Ht 71.0 in | Wt 287.6 lb

## 2022-08-16 DIAGNOSIS — I10 Essential (primary) hypertension: Secondary | ICD-10-CM | POA: Diagnosis not present

## 2022-08-16 DIAGNOSIS — Z23 Encounter for immunization: Secondary | ICD-10-CM

## 2022-08-16 DIAGNOSIS — M79671 Pain in right foot: Secondary | ICD-10-CM | POA: Diagnosis not present

## 2022-08-16 DIAGNOSIS — G4733 Obstructive sleep apnea (adult) (pediatric): Secondary | ICD-10-CM

## 2022-08-16 DIAGNOSIS — Z9989 Dependence on other enabling machines and devices: Secondary | ICD-10-CM

## 2022-08-16 MED ORDER — LISINOPRIL 30 MG PO TABS: 30.0000 mg | ORAL_TABLET | Freq: Every day | ORAL | 3 refills | Status: DC

## 2022-08-16 NOTE — Progress Notes (Signed)
[facility] clinic  Provider: Durenda Age DNP  Code Status:  Full Code  Goals of Care:     07/09/2022    8:38 AM  Advanced Directives  Does Patient Have a Medical Advance Directive? No  Would patient like information on creating a medical advance directive? No - Patient declined     Chief Complaint  Patient presents with   Medical Management of Chronic Issues    Patient presents today for a 1 month hypertension follow-up.    HPI: Patient is a 40 y.o. male seen today for an acute visit for hypertension. He said that his SBPs at home were averaging at 130. Today in the clinic, BP 162/90. He takes Amlodipine, Lisinopril and Metoprolol succinate for hypertension.  Weight is 287.6 lbs, up from 284 lbs 2 months ago. He has been doing weight lifting as exercises. He said that he doesn't eat much.  He uses CPAP every night, has OSA  Right foot still hurting, was not able to do x-ray last time.  Past Medical History:  Diagnosis Date   Bursitis    Chicken pox    Hypertension    Migraine    Obesity     No past surgical history on file.  No Known Allergies  Outpatient Encounter Medications as of 08/16/2022  Medication Sig   amLODipine (NORVASC) 10 MG tablet Take 1 tablet (10 mg total) by mouth at bedtime.   aspirin 81 MG chewable tablet Chew 1 tablet (81 mg total) by mouth daily.   atorvastatin (LIPITOR) 80 MG tablet Take 1 tablet (80 mg total) by mouth daily.   diphenhydrAMINE (BENADRYL) 25 mg capsule Take 25 mg by mouth every 6 (six) hours as needed for allergies.   isosorbide mononitrate (IMDUR) 30 MG 24 hr tablet Take 0.5 tablets (15 mg total) by mouth daily.   lisinopril (ZESTRIL) 30 MG tablet Take 1 tablet (30 mg total) by mouth daily.   metoprolol succinate (TOPROL-XL) 25 MG 24 hr tablet Take 1 tablet (25 mg total) by mouth daily.   [DISCONTINUED] lisinopril (ZESTRIL) 20 MG tablet Take 1 tablet (20 mg total) by mouth daily.   [DISCONTINUED] fluticasone (FLONASE) 50  MCG/ACT nasal spray Place 2 sprays into both nostrils daily. (Patient not taking: Reported on 02/24/2020)   No facility-administered encounter medications on file as of 08/16/2022.    Review of Systems:  Review of Systems  Constitutional:  Negative for activity change, appetite change and fever.  HENT:  Negative for sore throat.   Eyes: Negative.   Cardiovascular:  Negative for chest pain and leg swelling.  Gastrointestinal:  Negative for abdominal distention, diarrhea and vomiting.  Genitourinary:  Negative for dysuria, frequency and urgency.  Skin:  Negative for color change.  Neurological:  Positive for headaches. Negative for dizziness.  Psychiatric/Behavioral:  Negative for behavioral problems and sleep disturbance. The patient is not nervous/anxious.     Health Maintenance  Topic Date Due   COVID-19 Vaccine (1) Never done   INFLUENZA VACCINE  06/19/2022   TETANUS/TDAP  05/14/2026   Hepatitis C Screening  Completed   HIV Screening  Completed   HPV VACCINES  Aged Out    Physical Exam: Vitals:   08/16/22 0840  BP: (!) 162/90  Pulse: 88  Temp: (!) 97.5 F (36.4 C)  SpO2: 97%  Weight: 287 lb 9.6 oz (130.5 kg)  Height: 5\' 11"  (1.803 m)   Body mass index is 40.11 kg/m. Physical Exam Constitutional:      Appearance:  He is obese.  HENT:     Head: Normocephalic and atraumatic.     Mouth/Throat:     Mouth: Mucous membranes are moist.  Eyes:     Conjunctiva/sclera: Conjunctivae normal.  Cardiovascular:     Rate and Rhythm: Normal rate and regular rhythm.     Pulses: Normal pulses.     Heart sounds: Normal heart sounds.  Pulmonary:     Effort: Pulmonary effort is normal.     Breath sounds: Normal breath sounds.  Abdominal:     General: Bowel sounds are normal.     Palpations: Abdomen is soft.  Musculoskeletal:        General: No swelling. Normal range of motion.     Cervical back: Normal range of motion.     Right lower leg: Edema present.     Left lower leg:  Edema present.     Comments: BLE trace edema  Skin:    General: Skin is warm and dry.  Neurological:     General: No focal deficit present.     Mental Status: He is alert and oriented to person, place, and time.  Psychiatric:        Mood and Affect: Mood normal.        Behavior: Behavior normal.        Thought Content: Thought content normal.        Judgment: Judgment normal.     Labs reviewed: Basic Metabolic Panel: Recent Labs    05/08/22 1144 05/09/22 0246 05/10/22 0310 05/11/22 0304  NA 137 138  --  139  K 3.5 3.5  --  3.6  CL 104 104  --  106  CO2 23 25  --  25  GLUCOSE 92 96  --  93  BUN 13 16  --  17  CREATININE 1.18 1.20  --  1.06  CALCIUM 9.2 9.1  --  9.2  MG  --  2.1  --  2.0  TSH  --   --  1.151  --    Liver Function Tests: Recent Labs    05/09/22 0246  AST 19  ALT 33  ALKPHOS 46  BILITOT 1.0  PROT 7.1  ALBUMIN 3.7   No results for input(s): "LIPASE", "AMYLASE" in the last 8760 hours. No results for input(s): "AMMONIA" in the last 8760 hours. CBC: Recent Labs    05/07/22 1948 05/09/22 0246 05/11/22 0304  WBC 9.9 7.9 9.2  NEUTROABS  --  5.7 7.1  HGB 16.1 15.7 15.3  HCT 48.7 48.6 47.6  MCV 80.4 82.8 82.9  PLT 238 205 232   Lipid Panel: Recent Labs    05/08/22 0546  CHOL 190  HDL 31*  LDLCALC 142*  TRIG 84  CHOLHDL 6.1   Lab Results  Component Value Date   HGBA1C 5.3 05/08/2022    Procedures since last visit: No results found.  Assessment/Plan  1. Uncontrolled hypertension -  BP 162/90, still elevated -  will increase Lisinopril 20 mg daily to 30 mg daily -   continue Amlodipine and IMDUR -   check BP and log - lisinopril (ZESTRIL) 30 MG tablet; Take 1 tablet (30 mg total) by mouth daily.  Dispense: 30 tablet; Refill: 3 - Metanephrines, urine, 24 hour; Future - Cortisol, Free and Cortisone, 24 hour urine with Creatinine  2. Morbidly obese ([place]) Wt Readings from Last 3 Encounters:  08/16/22 287 lb 9.6 oz (130.5 kg)   07/09/22 284 lb (128.8 kg)  05/10/22 277  lb 1.9 oz (125.7 kg)    -  continues to gain weight  -  discussed eating low carb diet, increase vegetables and fruits, and regular exercise. - Amb Ref to Medical Weight Management  3. OSA on CPAP -  continue use of CPAP at bedtime  4. Right foot pain -  was not able to do imaging ordered previously - DG Foot Complete Right; Future      Labs/tests ordered:  DG Foot Complete Right; Future, Metanephrines, urine, 24 hour; Future - Cortisol, Free and Cortisone, 24 hour urine with Creatinine   Next appt:  in 3 months/as needed

## 2022-08-16 NOTE — Patient Instructions (Signed)
Hypertension, Adult ?Hypertension is another name for high blood pressure. High blood pressure forces your heart to work harder to pump blood. This can cause problems over time. ?There are two numbers in a blood pressure reading. There is a top number (systolic) over a bottom number (diastolic). It is best to have a blood pressure that is below 120/80. ?What are the causes? ?The cause of this condition is not known. Some other conditions can lead to high blood pressure. ?What increases the risk? ?Some lifestyle factors can make you more likely to develop high blood pressure: ?Smoking. ?Not getting enough exercise or physical activity. ?Being overweight. ?Having too much fat, sugar, calories, or salt (sodium) in your diet. ?Drinking too much alcohol. ?Other risk factors include: ?Having any of these conditions: ?Heart disease. ?Diabetes. ?High cholesterol. ?Kidney disease. ?Obstructive sleep apnea. ?Having a family history of high blood pressure and high cholesterol. ?Age. The risk increases with age. ?Stress. ?What are the signs or symptoms? ?High blood pressure may not cause symptoms. Very high blood pressure (hypertensive crisis) may cause: ?Headache. ?Fast or uneven heartbeats (palpitations). ?Shortness of breath. ?Nosebleed. ?Vomiting or feeling like you may vomit (nauseous). ?Changes in how you see. ?Very bad chest pain. ?Feeling dizzy. ?Seizures. ?How is this treated? ?This condition is treated by making healthy lifestyle changes, such as: ?Eating healthy foods. ?Exercising more. ?Drinking less alcohol. ?Your doctor may prescribe medicine if lifestyle changes do not help enough and if: ?Your top number is above 130. ?Your bottom number is above 80. ?Your personal target blood pressure may vary. ?Follow these instructions at home: ?Eating and drinking ? ?If told, follow the DASH eating plan. To follow this plan: ?Fill one half of your plate at each meal with fruits and vegetables. ?Fill one fourth of your plate  at each meal with whole grains. Whole grains include whole-wheat pasta, brown rice, and whole-grain bread. ?Eat or drink low-fat dairy products, such as skim milk or low-fat yogurt. ?Fill one fourth of your plate at each meal with low-fat (lean) proteins. Low-fat proteins include fish, chicken without skin, eggs, beans, and tofu. ?Avoid fatty meat, cured and processed meat, or chicken with skin. ?Avoid pre-made or processed food. ?Limit the amount of salt in your diet to less than 1,500 mg each day. ?Do not drink alcohol if: ?Your doctor tells you not to drink. ?You are pregnant, may be pregnant, or are planning to become pregnant. ?If you drink alcohol: ?Limit how much you have to: ?0-1 drink a day for women. ?0-2 drinks a day for men. ?Know how much alcohol is in your drink. In the [place] one drink equals one 12 oz bottle of beer (355 mL), one 5 oz glass of wine (148 mL), or one 1? oz glass of hard liquor (44 mL). ?Lifestyle ? ?Work with your doctor to stay at a healthy weight or to lose weight. Ask your doctor what the best weight is for you. ?Get at least 30 minutes of exercise that causes your heart to beat faster (aerobic exercise) most days of the week. This may include walking, swimming, or biking. ?Get at least 30 minutes of exercise that strengthens your muscles (resistance exercise) at least 3 days a week. This may include lifting weights or doing Pilates. ?Do not smoke or use any products that contain nicotine or tobacco. If you need help quitting, ask your doctor. ?Check your blood pressure at home as told by your doctor. ?Keep all follow-up visits. ?Medicines ?Take over-the-counter and prescription medicines   only as told by your doctor. Follow directions carefully. ?Do not skip doses of blood pressure medicine. The medicine does not work as well if you skip doses. Skipping doses also puts you at risk for problems. ?Ask your doctor about side effects or reactions to medicines that you should watch  for. ?Contact a doctor if: ?You think you are having a reaction to the medicine you are taking. ?You have headaches that keep coming back. ?You feel dizzy. ?You have swelling in your ankles. ?You have trouble with your vision. ?Get help right away if: ?You get a very bad headache. ?You start to feel mixed up (confused). ?You feel weak or numb. ?You feel faint. ?You have very bad pain in your: ?Chest. ?Belly (abdomen). ?You vomit more than once. ?You have trouble breathing. ?These symptoms may be an emergency. Get help right away. Call 911. ?Do not wait to see if the symptoms will go away. ?Do not drive yourself to the hospital. ?Summary ?Hypertension is another name for high blood pressure. ?High blood pressure forces your heart to work harder to pump blood. ?For most people, a normal blood pressure is less than 120/80. ?Making healthy choices can help lower blood pressure. If your blood pressure does not get lower with healthy choices, you may need to take medicine. ?This information is not intended to replace advice given to you by your health care provider. Make sure you discuss any questions you have with your health care provider. ?Document Revised: 08/24/2021 Document Reviewed: 08/24/2021 ?Elsevier Patient Education ? 2023 Elsevier Inc. ? ?

## 2022-08-19 NOTE — Progress Notes (Signed)
  This encounter was created in error - please disregard. No show 

## 2022-09-18 ENCOUNTER — Encounter (INDEPENDENT_AMBULATORY_CARE_PROVIDER_SITE_OTHER): Payer: Self-pay

## 2022-09-20 ENCOUNTER — Encounter: Payer: BC Managed Care – PPO | Admitting: Adult Health

## 2022-09-20 ENCOUNTER — Encounter: Payer: Self-pay | Admitting: Adult Health

## 2022-09-20 NOTE — Progress Notes (Deleted)
01/03/1981 

## 2022-09-20 NOTE — Progress Notes (Signed)
This encounter was created in error - please disregard.

## 2022-09-23 NOTE — Progress Notes (Signed)
This encounter was created in error - please disregard.

## 2022-09-23 NOTE — Progress Notes (Deleted)
This encounter was created in error - please disregard.

## 2022-12-08 ENCOUNTER — Encounter (INDEPENDENT_AMBULATORY_CARE_PROVIDER_SITE_OTHER): Payer: Self-pay | Admitting: Internal Medicine

## 2022-12-09 IMAGING — DX DG CHEST 2V
2 series · 2 of 2 positions shown · non-contrast
Comparison: 02/24/2020

CLINICAL DATA: Chest pain starting 2 hours prior to admission.

EXAM:
CHEST - 2 VIEW

[chest pa]
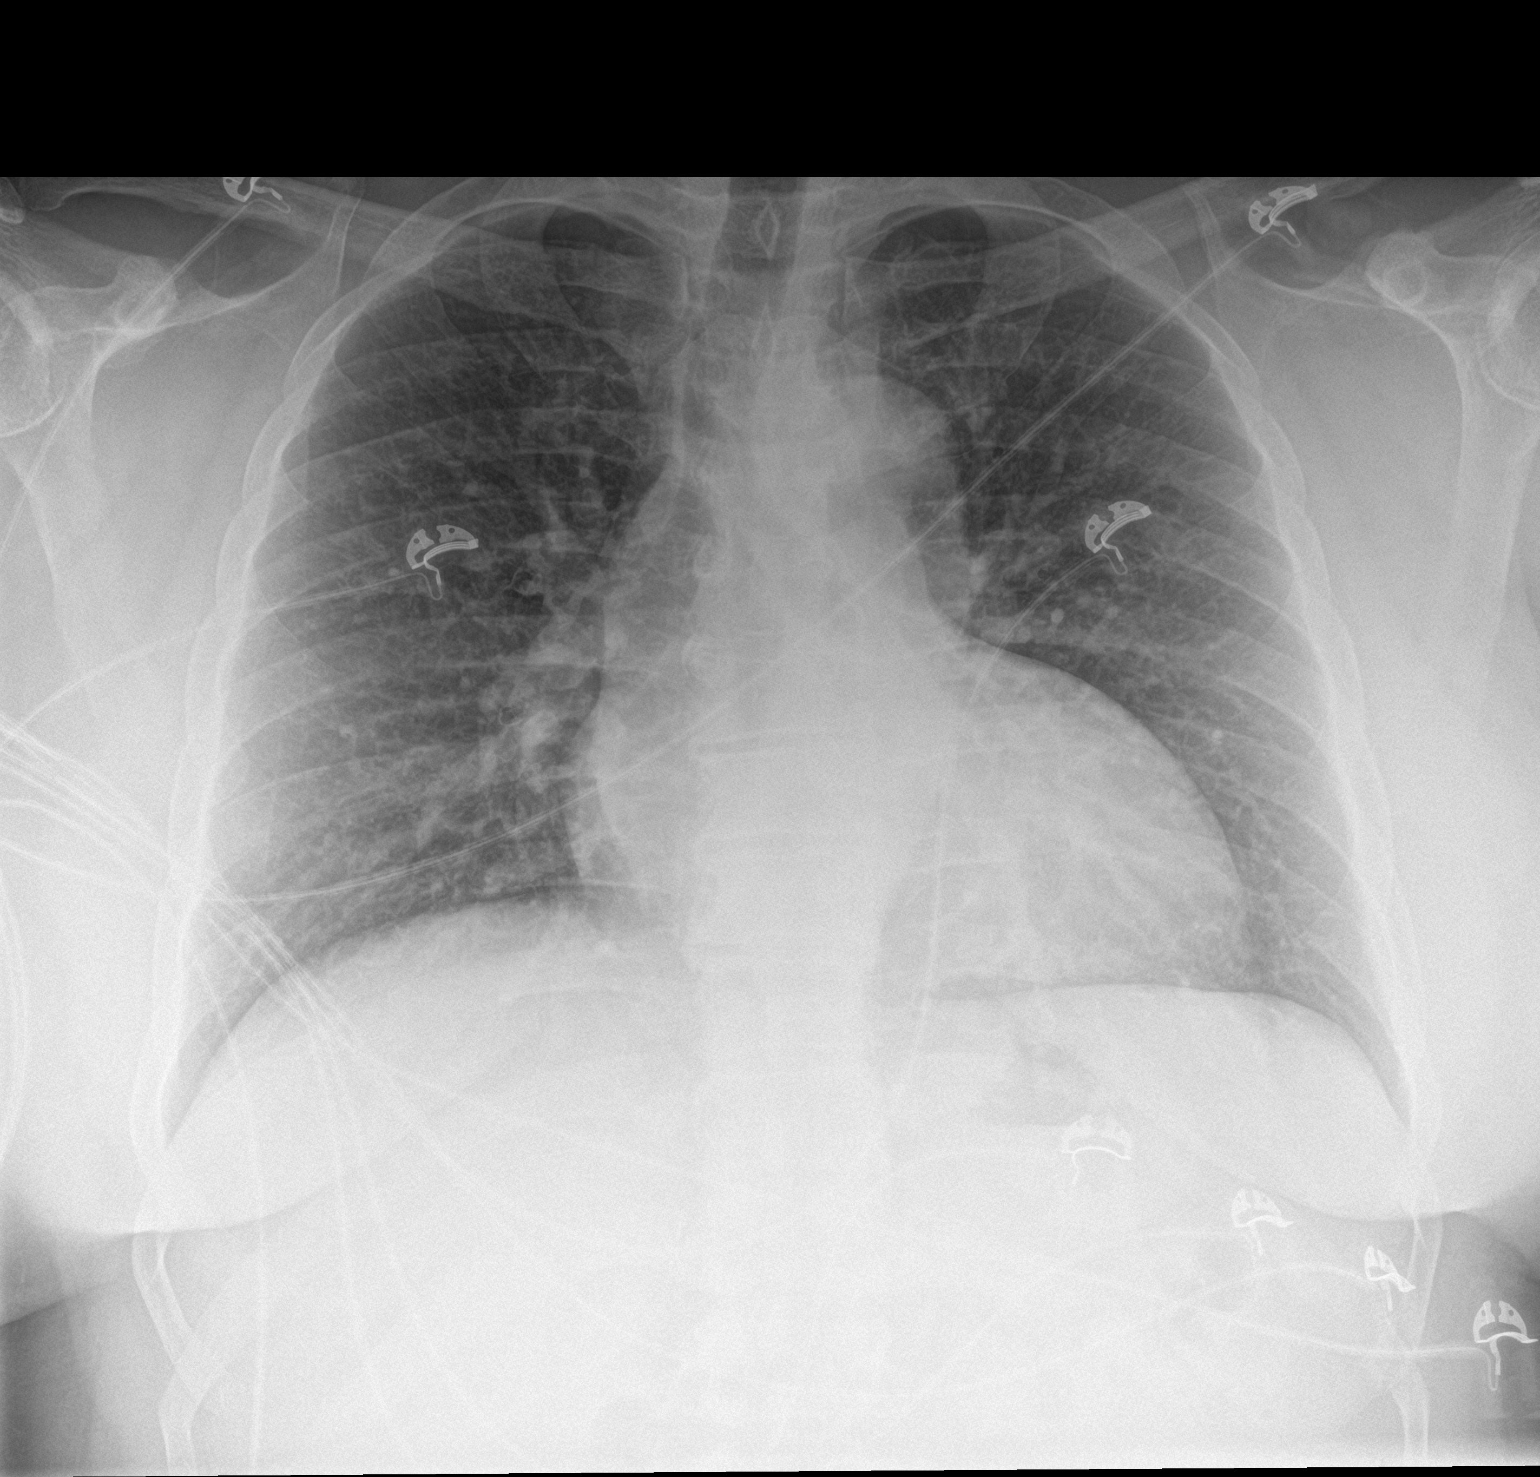

[chest lat]
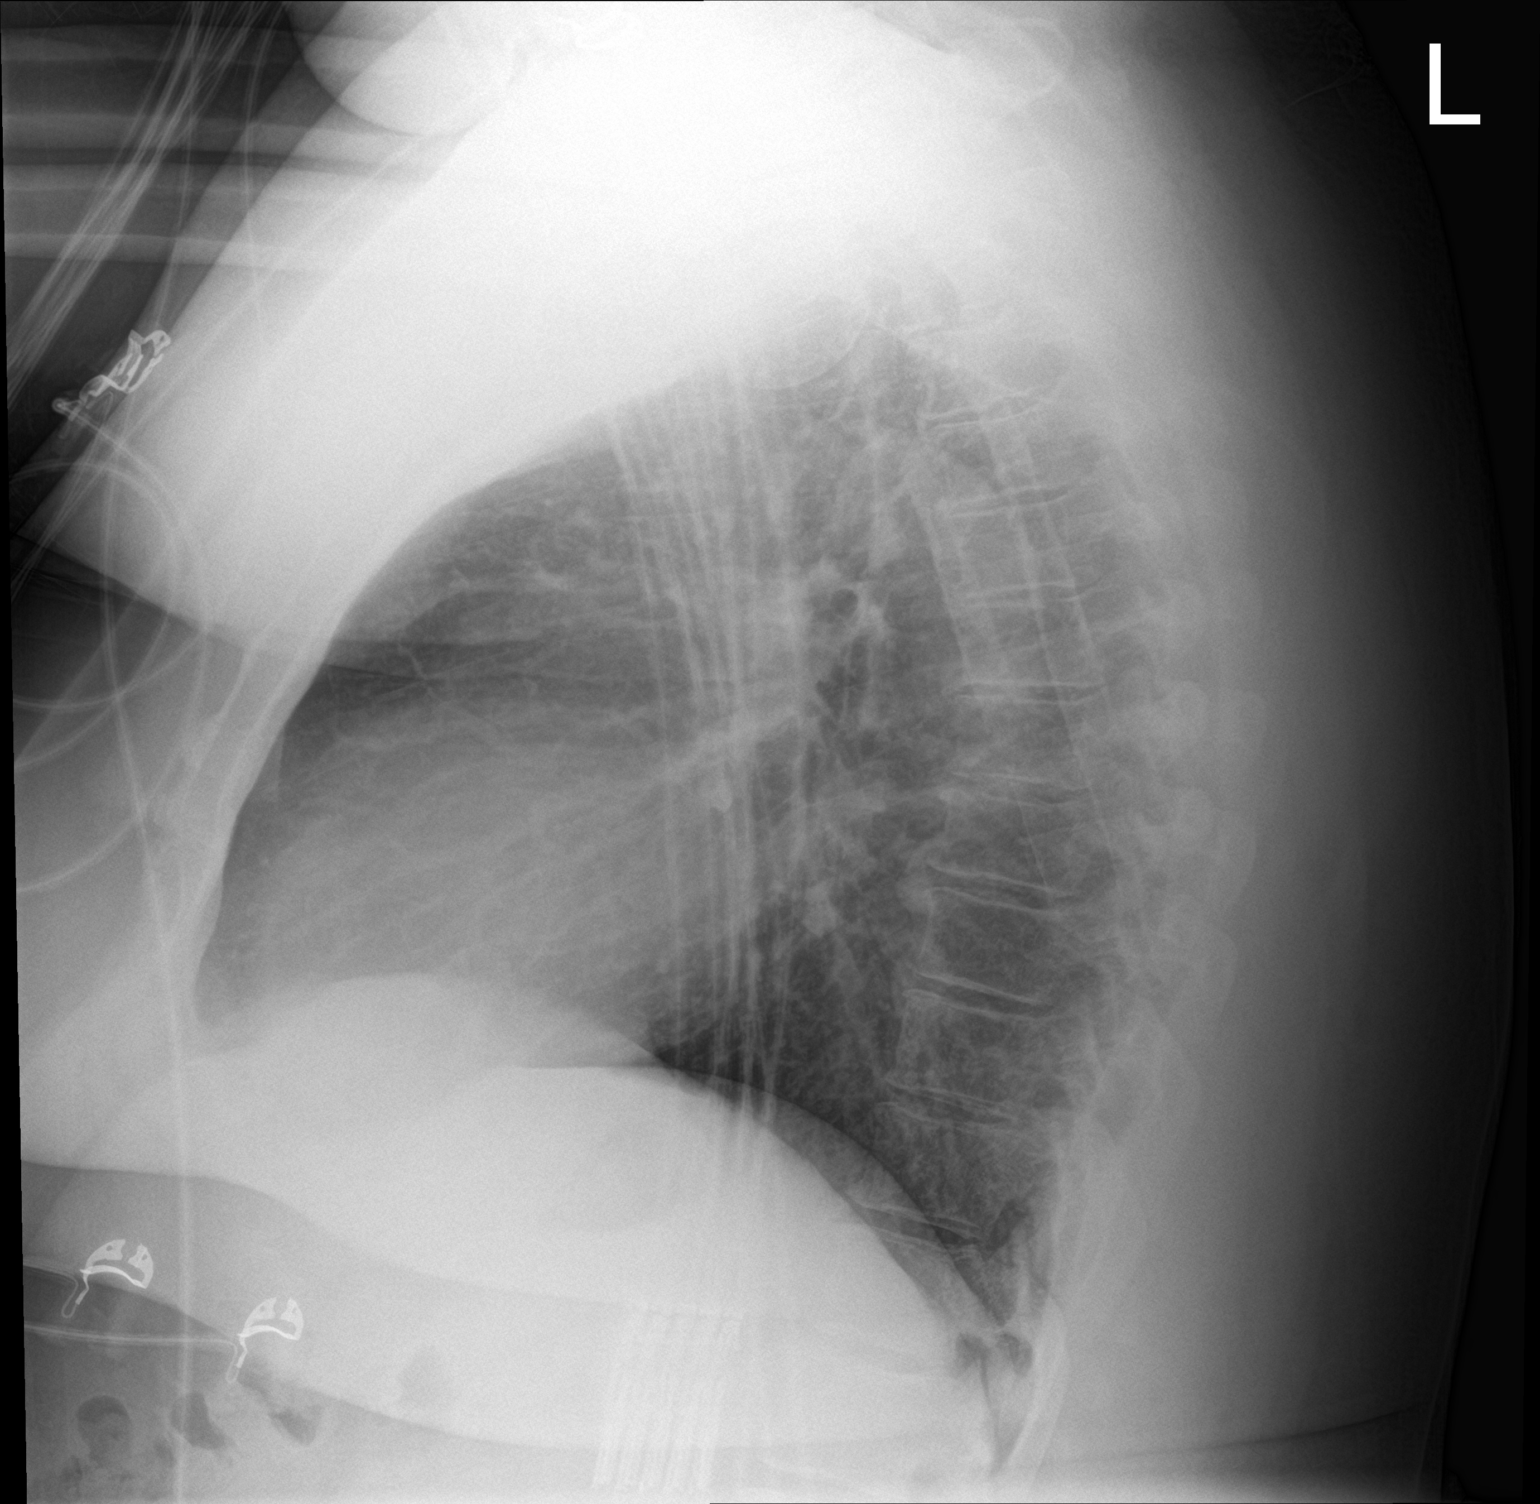

[2 of 2 positions shown; findings below may reference images not displayed]

FINDINGS: The heart size and mediastinal contours are within normal limits.
Both lungs are clear. The visualized skeletal structures are
unremarkable.
IMPRESSION: No active cardiopulmonary disease.

## 2022-12-10 IMAGING — CT CT ANGIO CHEST-ABD-PELV FOR DISSECTION W/ AND WO/W CM
2 of 8 series · 14 of 46 positions shown, 16 images · non-contrast
Comparison: None Available.

CLINICAL DATA: Chest pain on nitroglycerin drip, evaluate for acute
aortic syndrome

EXAM:
CT ANGIOGRAPHY CHEST, ABDOMEN AND PELVIS
TECHNIQUE: Non-contrast CT of the chest was initially obtained.

[Series 6: axial arterial · axial · arterial · 0.82mm/px · z∈[+1118,+1634]mm · 11 of 208 slices shown, 13 images]
[im 18/208  soft-tissue]
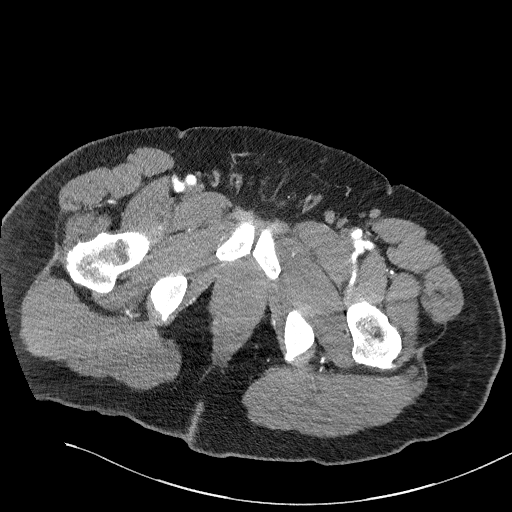
[im 18/208  bone]
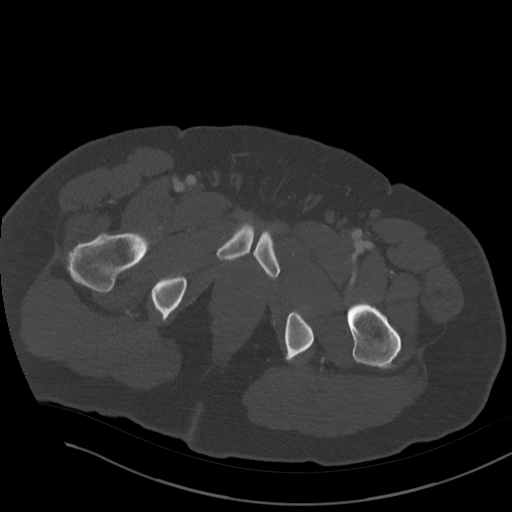
[im 35/208  soft-tissue]
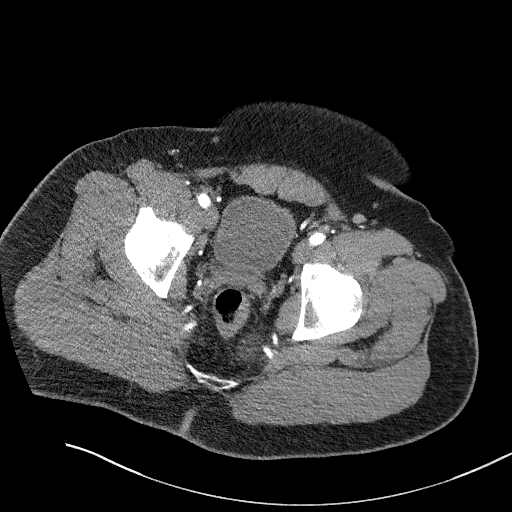
[im 52/208  soft-tissue]
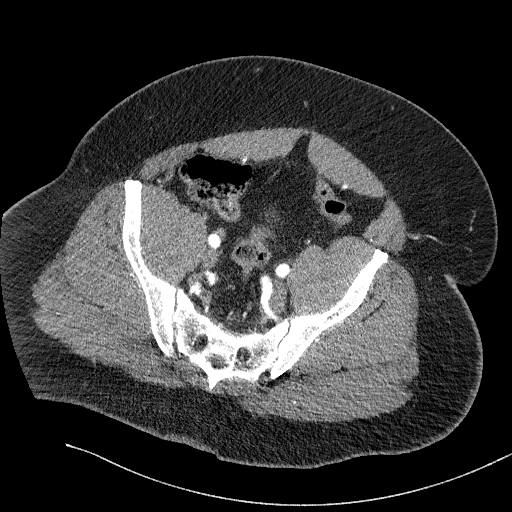
[im 70/208  soft-tissue]
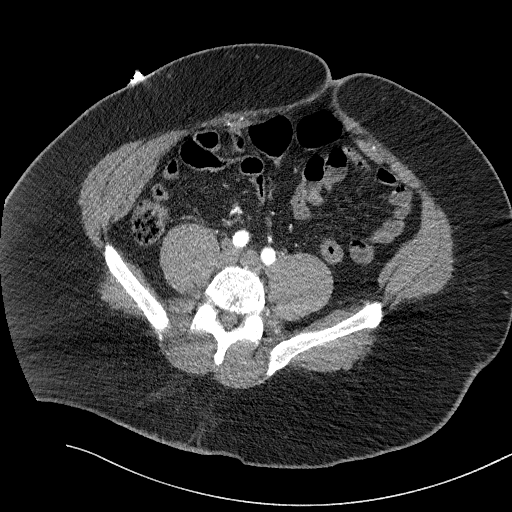
[im 87/208  soft-tissue]
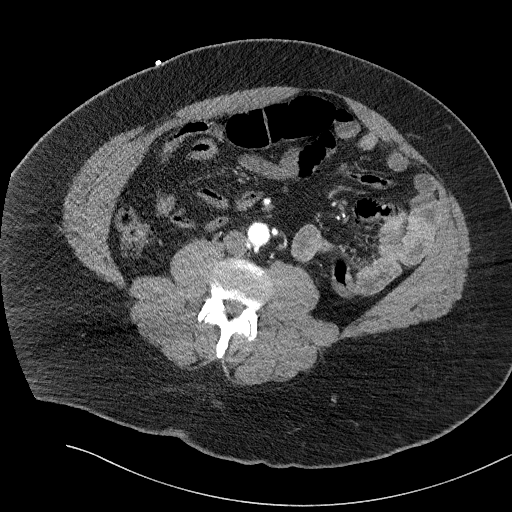
[im 104/208  soft-tissue]
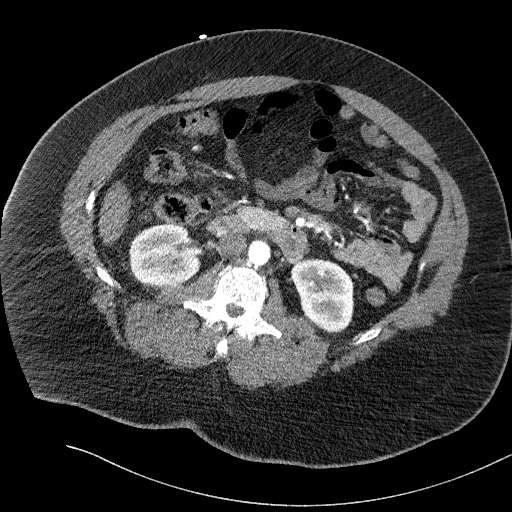
[im 121/208  soft-tissue]
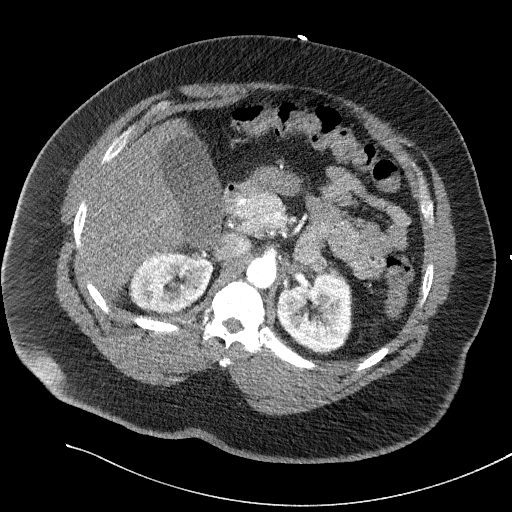
[im 139/208  soft-tissue]
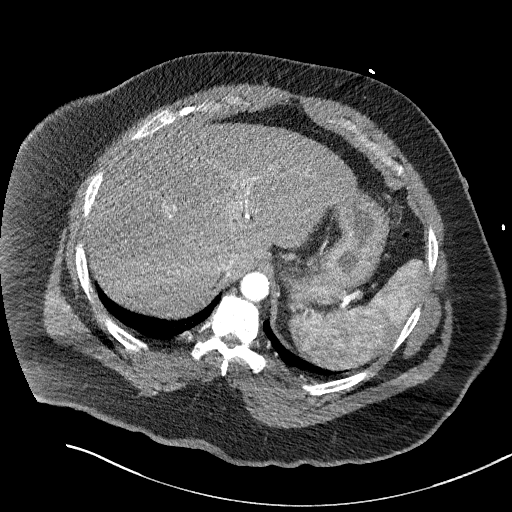
[im 156/208  soft-tissue]
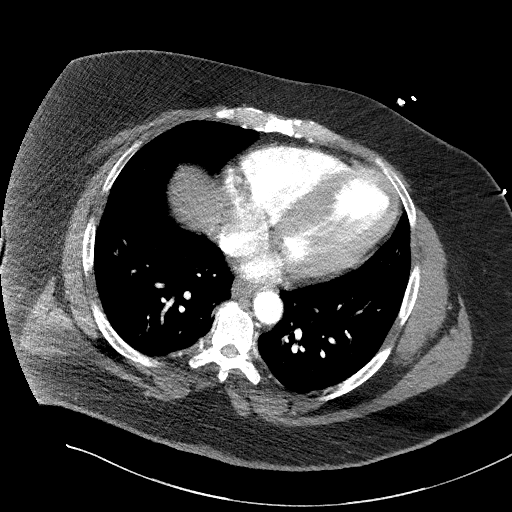
[im 156/208  bone]
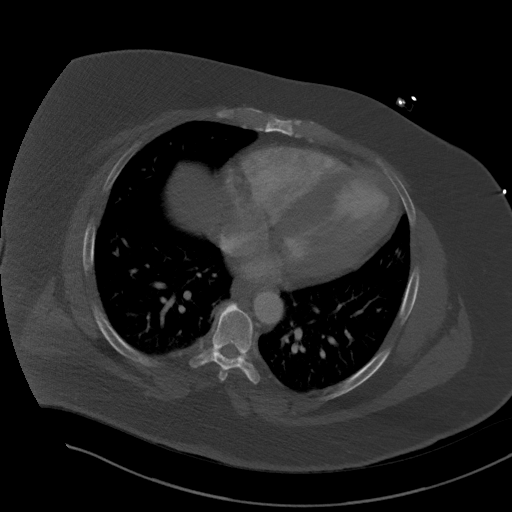
[im 173/208  soft-tissue]
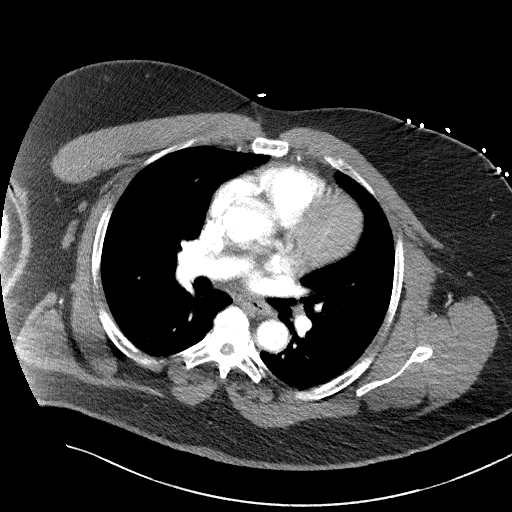
[im 190/208  soft-tissue]
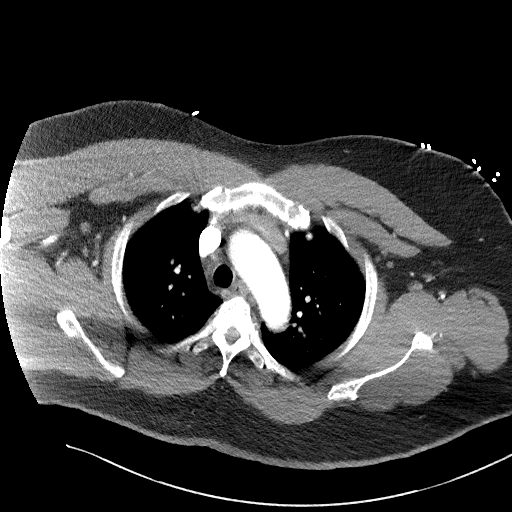

[Series 9: coronals · coronal · 0.79mm/px · 3 of 171 slices shown]
[im 43/171  soft-tissue]
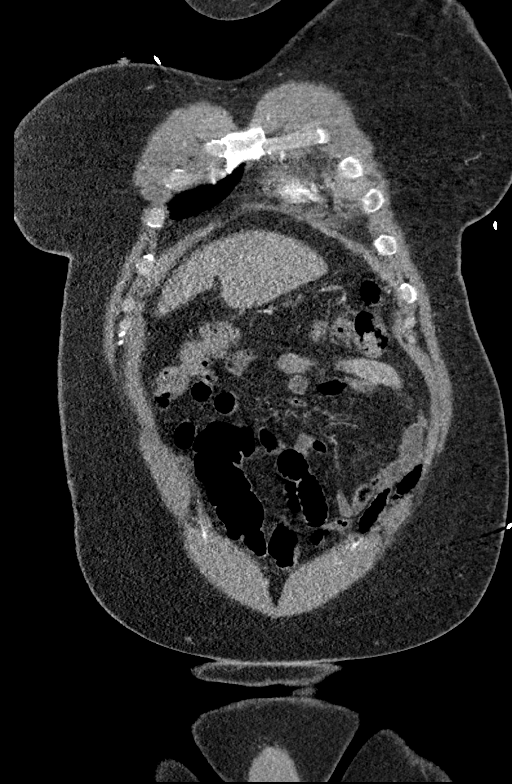
[im 86/171  soft-tissue]
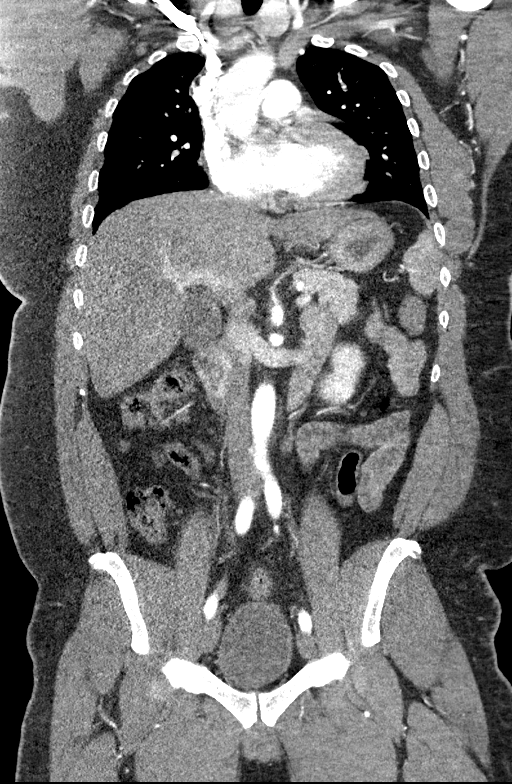
[im 128/171  soft-tissue]
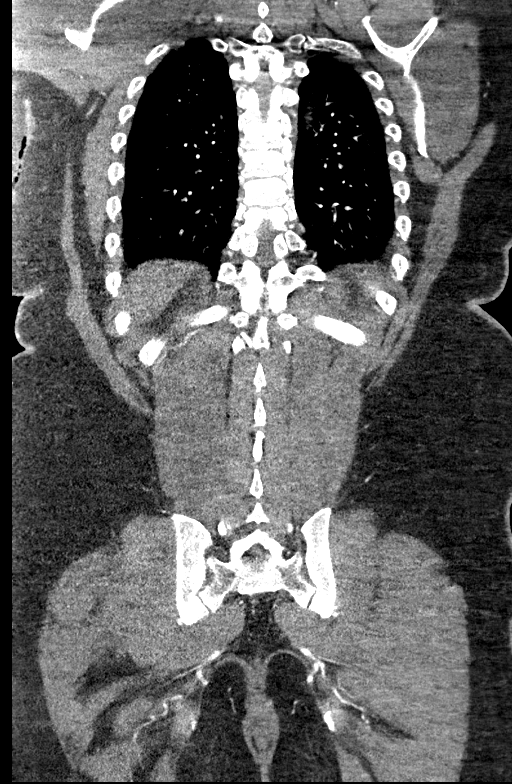

[14 of 46 positions shown; findings below may reference images not displayed]

Multidetector CT imaging through the chest, abdomen and pelvis was
performed using the standard protocol during bolus administration of
intravenous contrast. Multiplanar reconstructed images and MIPs were
obtained and reviewed to evaluate the vascular anatomy.

RADIATION DOSE REDUCTION: This exam was performed according to the
departmental dose-optimization program which includes automated
exposure control, adjustment of the mA and/or kV according to
patient size and/or use of iterative reconstruction technique.

CONTRAST:  100mL OMNIPAQUE IOHEXOL 350 MG/ML SOLN
FINDINGS: CTA CHEST FINDINGS

Cardiovascular: Preferential opacification of the thoracic aorta. No
evidence of thoracic aortic aneurysm or dissection. Heart size is
normal. No pericardial effusion. Atheromatous calcification of
left-sided coronary arteries.

Mediastinum/Nodes: Negative for adenopathy or hematoma

Lungs/Pleura: Lungs are clear. No pleural effusion or pneumothorax.

Musculoskeletal: Thoracic spondylosis.

Review of the MIP images confirms the above findings.

CTA ABDOMEN AND PELVIS FINDINGS

VASCULAR

Aorta: Normal caliber aorta without aneurysm, dissection, vasculitis
or significant stenosis.

Celiac: Unremarkable

SMA: Unremarkable

Renals: Single bilateral renal arteries are smoothly contoured and
widely patent

IMA: Unremarkable

Inflow: Unremarkable

Veins: Negative in the arterial phase

Review of the MIP images confirms the above findings.

NON-VASCULAR

Hepatobiliary: No focal liver abnormality.No evidence of biliary
obstruction or stone.

Pancreas: Unremarkable.

Spleen: Unremarkable.

Adrenals/Urinary Tract: Negative adrenals. No hydronephrosis or
stone. Unremarkable bladder.

Stomach/Bowel:  No obstruction. No visible inflammation.

Lymphatic: No mass or adenopathy.

Reproductive:No pathologic findings.

Other: No ascites or pneumoperitoneum.

Musculoskeletal: No acute abnormalities.

Review of the MIP images confirms the above findings.
IMPRESSION: 1. Negative for acute aortic syndrome.
2. Coronary calcification.

## 2022-12-11 ENCOUNTER — Encounter (INDEPENDENT_AMBULATORY_CARE_PROVIDER_SITE_OTHER): Payer: BC Managed Care – PPO | Admitting: Internal Medicine

## 2023-02-06 ENCOUNTER — Other Ambulatory Visit: Payer: Self-pay | Admitting: Adult Health

## 2023-02-06 DIAGNOSIS — I10 Essential (primary) hypertension: Secondary | ICD-10-CM

## 2023-03-27 ENCOUNTER — Other Ambulatory Visit: Payer: Self-pay | Admitting: Adult Health

## 2023-03-27 DIAGNOSIS — I10 Essential (primary) hypertension: Secondary | ICD-10-CM
